# Patient Record
Sex: Female | Born: 1945 | Race: White | Hispanic: No | Marital: Married | Smoking: Never smoker
Health system: Southern US, Community
[De-identification: ages and names within clinical notes are randomized; demographics above are authoritative.]

## PROBLEM LIST (undated history)

## (undated) DIAGNOSIS — M2042 Other hammer toe(s) (acquired), left foot: Secondary | ICD-10-CM

## (undated) DIAGNOSIS — Z973 Presence of spectacles and contact lenses: Secondary | ICD-10-CM

## (undated) DIAGNOSIS — F419 Anxiety disorder, unspecified: Secondary | ICD-10-CM

## (undated) DIAGNOSIS — Z974 Presence of external hearing-aid: Secondary | ICD-10-CM

## (undated) HISTORY — PX: URETHROPEXY: SHX1081

## (undated) HISTORY — PX: OTHER SURGICAL HISTORY: SHX169

## (undated) HISTORY — PX: VAGINAL PROLAPSE REPAIR: SHX830

---

## 1973-11-23 HISTORY — PX: UMBILICAL HERNIA REPAIR: SHX196

## 1993-11-23 HISTORY — PX: VAGINAL PROLAPSE REPAIR: SHX830

## 1993-11-23 HISTORY — PX: URETHROPEXY: SHX1081

## 2000-04-29 ENCOUNTER — Other Ambulatory Visit: Admission: RE | Admit: 2000-04-29 | Discharge: 2000-04-29 | Payer: Self-pay | Admitting: Family Medicine

## 2007-08-15 ENCOUNTER — Other Ambulatory Visit: Admission: RE | Admit: 2007-08-15 | Discharge: 2007-08-15 | Payer: Self-pay | Admitting: Family Medicine

## 2012-07-08 ENCOUNTER — Ambulatory Visit (INDEPENDENT_AMBULATORY_CARE_PROVIDER_SITE_OTHER): Payer: BC Managed Care – PPO | Admitting: Physician Assistant

## 2012-07-08 VITALS — BP 132/84 | HR 65 | Temp 97.8°F | Resp 18 | Ht 61.5 in | Wt 149.0 lb

## 2012-07-08 DIAGNOSIS — H66009 Acute suppurative otitis media without spontaneous rupture of ear drum, unspecified ear: Secondary | ICD-10-CM

## 2012-07-08 DIAGNOSIS — S61409A Unspecified open wound of unspecified hand, initial encounter: Secondary | ICD-10-CM

## 2012-07-08 MED ORDER — AMOXICILLIN 875 MG PO TABS: 875.0000 mg | ORAL_TABLET | Freq: Two times a day (BID) | ORAL | Status: AC

## 2012-07-08 NOTE — Progress Notes (Signed)
  Subjective:    Patient ID: Morgan Gibbs, female    DOB: 04-26-1946, 66 y.o.   MRN: [id number]  HPI  Pt presents to clinic with 24h of ear pain.  She has been in [place] and developed a little cold which has improved but last pm she started to have R ear pain which has gotten worse.  She took some Aleve which did help.  She is having no more than normal hearing loss.    Review of Systems  Constitutional: Negative for fever and chills.  HENT: Positive for hearing loss, ear pain and congestion. Negative for tinnitus and ear discharge.        Objective:   Physical Exam  Vitals reviewed. Constitutional: She is oriented to person, place, and time. She appears well-developed and well-nourished.  HENT:  Head: Normocephalic and atraumatic.  Right Ear: External ear normal.  Left Ear: External ear normal.  Nose: Nose normal.  Mouth/Throat: Oropharynx is clear and moist. No oropharyngeal exudate.  Eyes: Conjunctivae are normal.  Neck: Neck supple.  Cardiovascular: Normal rate, regular rhythm and normal heart sounds.   No murmur heard. Pulmonary/Chest: Effort normal and breath sounds normal.  Lymphadenopathy:    She has no cervical adenopathy.  Neurological: She is alert and oriented to person, place, and time.  Skin: Skin is warm and dry.  Psychiatric: She has a normal mood and affect. Her behavior is normal. Judgment and thought content normal.          Assessment & Plan:   1. Acute suppurative otitis media without spontaneous rupture of eardrum  amoxicillin (AMOXIL) 875 MG tablet   Pt to continue Zyrtec and she can add Mucinex to help with any remaining sinus congestion from her cold.  She should continue Aleve for her pain.

## 2013-08-24 ENCOUNTER — Ambulatory Visit (INDEPENDENT_AMBULATORY_CARE_PROVIDER_SITE_OTHER): Payer: 59 | Admitting: Physician Assistant

## 2013-08-24 VITALS — BP 120/82 | HR 66 | Temp 98.2°F | Resp 18 | Ht 60.5 in | Wt 148.8 lb

## 2013-08-24 DIAGNOSIS — H60399 Other infective otitis externa, unspecified ear: Secondary | ICD-10-CM

## 2013-08-24 DIAGNOSIS — H6691 Otitis media, unspecified, right ear: Secondary | ICD-10-CM

## 2013-08-24 DIAGNOSIS — H60391 Other infective otitis externa, right ear: Secondary | ICD-10-CM

## 2013-08-24 DIAGNOSIS — H6690 Otitis media, unspecified, unspecified ear: Secondary | ICD-10-CM

## 2013-08-24 MED ORDER — CIPROFLOXACIN-DEXAMETHASONE 0.3-0.1 % OT SUSP: 4.0000 [drp] | Freq: Two times a day (BID) | OTIC | Status: DC

## 2013-08-24 MED ORDER — AMOXICILLIN 875 MG PO TABS: 875.0000 mg | ORAL_TABLET | Freq: Two times a day (BID) | ORAL | Status: DC

## 2013-08-24 NOTE — Patient Instructions (Addendum)
Begin taking the amoxicillin as directed.  Be sure to finish the full course.  Also begin using the drops as directed.  Continue using Zyrtec daily to help relieve congestion and ear pressure.  You can also try a short course (3 days or less) of Sudafed.  We can consider using a nasal spray like Flonase long term to help control allergies/congestion.  Please let us know if any symptoms are worsening or not improving   Otitis Media, Adult A middle ear infection is an infection in the space behind the eardrum. The medical name for this is "otitis media." It may happen after a common cold. It is caused by a germ that starts growing in that space. You may feel swollen glands in your neck on the side of the ear infection. HOME CARE INSTRUCTIONS   Take your medicine as directed until it is gone, even if you feel better after the first few days.  Only take over-the-counter or prescription medicines for pain, discomfort, or fever as directed by your caregiver.  Occasional use of a nasal decongestant a couple times per day may help with discomfort and help the eustachian tube to drain better. Follow up with your caregiver in 10 to 14 days or as directed, to be certain that the infection has cleared. Not keeping the appointment could result in a chronic or permanent injury, pain, hearing loss and disability. If there is any problem keeping the appointment, you must call back to this facility for assistance. SEEK IMMEDIATE MEDICAL CARE IF:   You are not getting better in 2 to 3 days.  You have pain that is not controlled with medication.  You feel worse instead of better.  You cannot use the medication as directed.  You develop swelling, redness or pain around the ear or stiffness in your neck. MAKE SURE YOU:   Understand these instructions.  Will watch your condition.  Will get help right away if you are not doing well or get worse. Document Released: 08/14/2004 Document Revised: 02/01/2012  Document Reviewed: 06/15/2008 [facility] Patient Information 2014 [place], [place].

## 2013-08-24 NOTE — Progress Notes (Signed)
  Subjective:    Patient ID: Morgan Gibbs, female    DOB: [birth year], 67 y.o.   MRN: [id number]  HPI   Morgan Gibbs is a pleasant 67 yr old female here with concern for right ear pain and drainage.  States the ear has been draining for 3 days now, just started hurting this morning.  Left ear is ok.  Has some mild congestion.  No nasal drainage, no cough.  No fever.  Hearing is diminished on the right, but states her hearing is reduced somewhat at baseline.  Otherwise feels well.    Review of Systems  Constitutional: Negative for fever and chills.  HENT: Positive for hearing loss, ear pain, congestion and ear discharge. Negative for sore throat, rhinorrhea, sneezing, postnasal drip and sinus pressure.   Respiratory: Negative for cough, shortness of breath and wheezing.   Cardiovascular: Negative.   Gastrointestinal: Negative.   Musculoskeletal: Negative.   Skin: Negative.   Neurological: Negative.        Objective:   Physical Exam  Vitals reviewed. Constitutional: She is oriented to person, place, and time. She appears well-developed and well-nourished. No distress.  HENT:  Head: Normocephalic and atraumatic.  Right Ear: There is drainage and tenderness. Tympanic membrane is perforated (possibly, could not visualize completely) and bulging (with purulent material visible behind TM). Decreased hearing is noted.  Left Ear: Tympanic membrane, external ear and ear canal normal.  Eyes: Conjunctivae are normal. No scleral icterus.  Cardiovascular: Normal rate, regular rhythm and normal heart sounds.   Pulmonary/Chest: Effort normal and breath sounds normal. She has no wheezes. She has no rales.  Neurological: She is alert and oriented to person, place, and time.  Skin: Skin is warm and dry.  Psychiatric: She has a normal mood and affect. Her behavior is normal.         Assessment & Plan:  Otitis media, right - Plan: amoxicillin (AMOXIL) 875 MG tablet  Otitis, externa,  infective, right - Plan: ciprofloxacin-dexamethasone (CIPRODEX) otic suspension   Morgan Gibbs is a 67 yr old female here with otitis media and otitis externa.  Will cover with both amoxicillin and ciprodex drops.  Zyrtec to help with congestion.  Tylenol/Advil for pain relief.  RTC if worsening or not improving.

## 2014-01-23 ENCOUNTER — Ambulatory Visit (INDEPENDENT_AMBULATORY_CARE_PROVIDER_SITE_OTHER): Payer: 59 | Admitting: Emergency Medicine

## 2014-01-23 VITALS — BP 126/78 | HR 67 | Temp 97.9°F | Resp 16 | Ht 60.5 in | Wt 147.2 lb

## 2014-01-23 DIAGNOSIS — H66019 Acute suppurative otitis media with spontaneous rupture of ear drum, unspecified ear: Secondary | ICD-10-CM

## 2014-01-23 DIAGNOSIS — H6120 Impacted cerumen, unspecified ear: Secondary | ICD-10-CM

## 2014-01-23 MED ORDER — AMOXICILLIN 500 MG PO CAPS: 500.0000 mg | ORAL_CAPSULE | Freq: Three times a day (TID) | ORAL | Status: DC

## 2014-01-23 MED ORDER — OFLOXACIN 0.3 % OT SOLN: 10.0000 [drp] | Freq: Every day | OTIC | Status: DC

## 2014-01-23 NOTE — Patient Instructions (Addendum)
Otitis Media With Effusion Otitis media with effusion is the presence of fluid in the middle ear. This is a common problem in children, which often follows ear infections. It may be present for weeks or longer after the infection. Unlike an acute ear infection, otitis media with effusion refers only to fluid behind the ear drum and not infection. Children with repeated ear and sinus infections and allergy problems are the most likely to get otitis media with effusion. CAUSES  The most frequent cause of the fluid buildup is dysfunction of the eustachian tubes. These are the tubes that drain fluid in the ears to the to the back of the nose (nasopharynx). SYMPTOMS   The main symptom of this condition is hearing loss. As a result, you or your child may:  Listen to the TV at a loud volume.  Not respond to questions.  Ask "what" often when spoken to.  Mistake or confuse on sound or word for another.  There may be a sensation of fullness or pressure but usually not pain. DIAGNOSIS   Your health care provider will diagnose this condition by examining you or your child's ears.  Your health care provider may test the pressure in you or your child's ear with a tympanometer.  A hearing test may be conducted if the problem persists. TREATMENT   Treatment depends on the duration and the effects of the effusion.  Antibiotics, decongestants, nose drops, and cortisone-type drugs (tablets or nasal spray) may not be helpful.  Children with persistent ear effusions may have delayed language or behavioral problems. Children at risk for developmental delays in hearing, learning, and speech may require referral to a specialist earlier than children not at risk.  You or your child's health care provider may suggest a referral to an ear, nose, and throat surgeon for treatment. The following may help restore normal hearing:  Drainage of fluid.  Placement of ear tubes (tympanostomy tubes).  Removal of  adenoids (adenoidectomy). HOME CARE INSTRUCTIONS   Avoid second hand smoke.  Infants who are breast fed are less likely to have this condition.  Avoid feeding infants while laying flat.  Avoid known environmental allergens.  Avoid people who are sick. SEEK MEDICAL CARE IF:   Hearing is not better in 3 months.  Hearing is worse.  Ear pain.  Drainage from the ear.  Dizziness. MAKE SURE YOU:   Understand these instructions.  Will watch your condition.  Will get help right away if you are not doing well or get worse. Document Released: 12/17/2004 Document Revised: 08/30/2013 Document Reviewed: 06/06/2013 [employer] Patient Information 2014 [employer].  

## 2014-01-23 NOTE — Progress Notes (Signed)
[facility] [address], [place] [id number] [phone]  Date:  01/23/2014   Name:  Morgan CorriganChristine E Gibbs   DOB:  06/19/1946   MRN:  [id number]  PCP:  No PCP Per Patient    Chief Complaint: Otalgia   History of Present Illness:  Morgan Gibbs is a 68 y.o. very pleasant female patient who presents with the following:  1 week history of pain in both ears and nasal congestion. No fever or chills.  No drainage.  No sore throat.  No improvement with over the counter medications or other home remedies.  Denies other complaint or health concern today.   There are no active problems to display for this patient.   History reviewed. No pertinent past medical history.  Past Surgical History  Procedure Laterality Date  . Bladder tact      History  Substance Use Topics  . Smoking status: Never Smoker   . Smokeless tobacco: Not on file  . Alcohol Use: No    History reviewed. No pertinent family history.  No Known Allergies  Medication list has been reviewed and updated.  No current outpatient prescriptions on file prior to visit.   No current facility-administered medications on file prior to visit.    Review of Systems:  As per HPI, otherwise negative.    Physical Examination: Filed Vitals:   01/23/14 1412  BP: 126/78  Pulse: 67  Temp: 97.9 F (36.6 C)  Resp: 16   Filed Vitals:   01/23/14 1412  Height: 5' 0.5" (1.537 m)  Weight: 147 lb 3.2 oz (66.769 kg)   Body mass index is 28.26 kg/(m^2). Ideal Body Weight: Weight in (lb) to have BMI = 25: 129.9  GEN: WDWN, NAD, Non-toxic, A & O x 3 HEENT: Atraumatic, Normocephalic. Neck supple. No masses, No LAD.  Left perforated TM with drainage Ears and Nose: No external deformity. CV: RRR, No M/G/R. No JVD. No thrill. No extra heart sounds. PULM: CTA B, no wheezes, crackles, rhonchi. No retractions. No resp. distress. No accessory muscle use. ABD: S, NT, ND, +BS. No rebound. No  HSM. EXTR: No c/c/e NEURO Normal gait.  PSYCH: Normally interactive. Conversant. Not depressed or anxious appearing.  Calm demeanor.    Assessment and Plan: Right cerumen impaction Left OM with perforation floxin otic Amoxicillin ENT  Signed,  Phillips OdorJeffery Evianna Chandran, MD

## 2014-08-03 ENCOUNTER — Encounter: Payer: Self-pay | Admitting: *Deleted

## 2014-08-28 ENCOUNTER — Encounter: Payer: Self-pay | Admitting: *Deleted

## 2015-01-21 ENCOUNTER — Encounter: Payer: Self-pay | Admitting: *Deleted

## 2015-03-26 ENCOUNTER — Encounter: Payer: Self-pay | Admitting: *Deleted

## 2016-11-23 DIAGNOSIS — E039 Hypothyroidism, unspecified: Secondary | ICD-10-CM

## 2016-11-23 DIAGNOSIS — E079 Disorder of thyroid, unspecified: Secondary | ICD-10-CM

## 2016-11-23 HISTORY — DX: Disorder of thyroid, unspecified: E07.9

## 2016-11-23 HISTORY — DX: Hypothyroidism, unspecified: E03.9

## 2017-02-09 ENCOUNTER — Other Ambulatory Visit (HOSPITAL_COMMUNITY)
Admission: RE | Admit: 2017-02-09 | Discharge: 2017-02-09 | Disposition: A | Discharge status: Home / Self Care | Payer: Medicare Other | Source: Ambulatory Visit | Attending: Family Medicine | Admitting: Family Medicine

## 2017-02-09 ENCOUNTER — Other Ambulatory Visit: Payer: Self-pay | Admitting: Family Medicine

## 2017-02-09 DIAGNOSIS — Z124 Encounter for screening for malignant neoplasm of cervix: Secondary | ICD-10-CM | POA: Diagnosis present

## 2017-02-11 LAB — CYTOLOGY - PAP: DIAGNOSIS: NEGATIVE

## 2017-03-23 ENCOUNTER — Ambulatory Visit (HOSPITAL_COMMUNITY)
Admission: EM | Admit: 2017-03-23 | Discharge: 2017-03-23 | Disposition: A | Discharge status: Home / Self Care | Payer: Medicare Other | Attending: Family Medicine | Admitting: Family Medicine

## 2017-03-23 ENCOUNTER — Encounter (HOSPITAL_COMMUNITY): Payer: Self-pay | Admitting: Family Medicine

## 2017-03-23 DIAGNOSIS — R0981 Nasal congestion: Secondary | ICD-10-CM

## 2017-03-23 DIAGNOSIS — F410 Panic disorder [episodic paroxysmal anxiety] without agoraphobia: Secondary | ICD-10-CM | POA: Diagnosis not present

## 2017-03-23 MED ORDER — DIAZEPAM 5 MG PO TABS: 5.0000 mg | ORAL_TABLET | Freq: Two times a day (BID) | ORAL | 0 refills | Status: DC | PRN

## 2017-03-23 MED ORDER — IPRATROPIUM BROMIDE 0.03 % NA SOLN: 2.0000 | Freq: Two times a day (BID) | NASAL | 0 refills | Status: DC

## 2017-03-23 NOTE — ED Provider Notes (Signed)
[facility]    CSN: [id number] Arrival date & time: 03/23/17  1859     History   Chief Complaint Chief Complaint  Patient presents with  . Shortness of Breath    HPI Morgan Gibbs is a 71 y.o. female presenting for "trouble getting air in."   She reports 3 weeks of URI symptoms including primarily congestion, as well as runny nose that have persisted constantly despite treatment with augmentin prescribed at urgent care. She has also used flonase, afrin, nasal saline, and mucinex D which have not improved symptoms. She has noticed, and this is corroborated by her husband, increasing anxiety related to inability to breathe in enough through her nose. She reports having a panic attack this morning due to this. She denies fevers, chest pain, palpitations.   HPI  History reviewed. No pertinent past medical history.  There are no active problems to display for this patient.   Past Surgical History:  Procedure Laterality Date  . Bladder tact      OB History    No data available       Home Medications    Prior to Admission medications   Medication Sig Start Date End Date Taking? Authorizing Provider  amoxicillin (AMOXIL) 500 MG capsule Take 1 capsule (500 mg total) by mouth 3 (three) times daily. 01/23/14   Carmelina Dane, MD  diazepam (VALIUM) 5 MG tablet Take 1 tablet (5 mg total) by mouth every 12 (twelve) hours as needed for anxiety. 03/23/17   Tyrone Nine, MD  ipratropium (ATROVENT) 0.03 % nasal spray Place 2 sprays into both nostrils every 12 (twelve) hours. 03/23/17   Tyrone Nine, MD  ofloxacin (FLOXIN) 0.3 % otic solution Place 10 drops into the left ear daily. 01/23/14   Carmelina Dane, MD    Family History History reviewed. No pertinent family history.  Social History Social History  Substance Use Topics  . Smoking status: Never Smoker  . Smokeless tobacco: Never Used  . Alcohol use No     Allergies   Patient has no known  allergies.   Review of Systems Review of Systems Per HPI  Physical Exam Triage Vital Signs ED Triage Vitals [03/23/17 1920]  Enc Vitals Group     BP (!) 155/100     Pulse Rate 74     Resp 20     Temp 98.2 F (36.8 C)     Temp src      SpO2 100 %     Weight      Height      Head Circumference      Peak Flow      Pain Score      Pain Loc      Pain Edu?      Excl. in GC?    No data found.   Updated Vital Signs BP (!) 155/100   Pulse 74   Temp 98.2 F (36.8 C)   Resp 20   SpO2 100%   Physical Exam  Constitutional: She appears well-developed and well-nourished. No distress.  HENT:  Head: Normocephalic and atraumatic.  Right Ear: External ear normal.  Left Ear: External ear normal.  Mouth/Throat: Oropharynx is clear and moist.  - tympanosclerosis noted, bilateral hearing aids.  - no oropharyngeal abnormality - turbinates occlusive/boggy bilaterally without discharge  Eyes: Conjunctivae are normal. Pupils are equal, round, and reactive to light.  Neck: Neck supple. No JVD present.  Cardiovascular: Normal rate and regular rhythm.  No murmur heard. Pulmonary/Chest: Effort normal and breath sounds normal. No respiratory distress.  Abdominal: Soft. There is no tenderness.  Musculoskeletal: She exhibits no edema.  Lymphadenopathy:    She has no cervical adenopathy.  Neurological: She is alert.  Skin: Skin is warm and dry.  Psychiatric: She has a normal mood and affect.  Nursing note and vitals reviewed.    UC Treatments / Results  Labs (all labs ordered are listed, but only abnormal results are displayed) Labs Reviewed - No data to display  EKG  EKG Interpretation None       Radiology No results found.  Procedures Procedures (including critical care time)  Medications Ordered in UC Medications - No data to display   Initial Impression / Assessment and Plan / UC Course  I have reviewed the triage vital signs and the nursing  notes.  Pertinent labs & imaging results that were available during my care of the patient were reviewed by me and considered in my medical decision making (see chart for details).  Final Clinical Impressions(s) / UC Diagnoses   Final diagnoses:  Nasal congestion  Panic attack   71 y.o. female with nasal congestion refractory to multiple treatments causing anxiety related to inability to breathe through nose. This caused a panic attack this morning. There are no exam findings suggestive of true hypoxemia or pulmonary disease.  - DC afrin, suspect rebound congestion - Trial atrovent nasal spray - Continue other therapies - Will Rx short course of low dose benzodiazepine with strict precautions provided.  - Follow up with PCP in next week. New Prescriptions New Prescriptions   DIAZEPAM (VALIUM) 5 MG TABLET    Take 1 tablet (5 mg total) by mouth every 12 (twelve) hours as needed for anxiety.   IPRATROPIUM (ATROVENT) 0.03 % NASAL SPRAY    Place 2 sprays into both nostrils every 12 (twelve) hours.     Tyrone Nine, MD 03/23/17 2006

## 2017-03-23 NOTE — ED Triage Notes (Signed)
Pt here for cough, SOB and feeling anxious. sts that at night she is having trouble breathing and it then makes her anxious and her lips become tingly. Sts that she was recently treated for sinus infection and not better. No fever.

## 2019-11-24 DIAGNOSIS — J189 Pneumonia, unspecified organism: Secondary | ICD-10-CM

## 2019-11-24 HISTORY — DX: Pneumonia, unspecified organism: J18.9

## 2019-12-14 ENCOUNTER — Encounter (HOSPITAL_COMMUNITY): Payer: Self-pay

## 2019-12-14 ENCOUNTER — Emergency Department (HOSPITAL_COMMUNITY)
Admission: EM | Admit: 2019-12-14 | Discharge: 2019-12-14 | Disposition: A | Discharge status: Home / Self Care | Payer: Medicare PPO | Attending: Emergency Medicine | Admitting: Emergency Medicine

## 2019-12-14 ENCOUNTER — Other Ambulatory Visit: Payer: Self-pay

## 2019-12-14 ENCOUNTER — Emergency Department (HOSPITAL_COMMUNITY): Payer: Medicare PPO

## 2019-12-14 DIAGNOSIS — U071 COVID-19: Secondary | ICD-10-CM | POA: Diagnosis not present

## 2019-12-14 DIAGNOSIS — R509 Fever, unspecified: Secondary | ICD-10-CM | POA: Diagnosis present

## 2019-12-14 DIAGNOSIS — J1282 Pneumonia due to coronavirus disease 2019: Secondary | ICD-10-CM | POA: Diagnosis not present

## 2019-12-14 NOTE — ED Notes (Signed)
An After Visit Summary was printed and given to the patient. Discharge instructions given and no further questions at this time. Pt denies SOB and chest pain at this time.  

## 2019-12-14 NOTE — ED Provider Notes (Signed)
[facility]EMERGENCY [facility]PT Provider Note   CSN: [id number] Arrival date & time: 12/14/19  1001     History Chief Complaint  Patient presents with  . COVID Positive  . Fever  . Fatigue    Morgan Gibbs is a 74 y.o. female.  Presents emerged from chief complaint Covid symptoms.  Patient states she was diagnosed with Covid on Monday, has had symptoms for approximately 8 days so far.  States she has been having low-grade fevers daily, has been having severe fatigue, occasional body aches, as well as dry cough.  Over the last few days she has felt somewhat more short of breath.  No difficulty in breathing at rest, states she feels short of breath when she tries to take a deep breath.  No chest pain.  Denies any chronic medical problems, no smoking history, no DVT, PE, CAD.  HPI     History reviewed. No pertinent past medical history.  There are no problems to display for this patient.   Past Surgical History:  Procedure Laterality Date  . Bladder tact       OB History   No obstetric history on file.     History reviewed. No pertinent family history.  Social History   Tobacco Use  . Smoking status: Never Smoker  . Smokeless tobacco: Never Used  Substance Use Topics  . Alcohol use: No  . Drug use: No    Home Medications Prior to Admission medications   Medication Sig Start Date End Date Taking? Authorizing Provider  amoxicillin (AMOXIL) 500 MG capsule Take 1 capsule (500 mg total) by mouth 3 (three) times daily. 01/23/14   Carmelina Dane, MD  diazepam (VALIUM) 5 MG tablet Take 1 tablet (5 mg total) by mouth every 12 (twelve) hours as needed for anxiety. 03/23/17   Tyrone Nine, MD  ipratropium (ATROVENT) 0.03 % nasal spray Place 2 sprays into both nostrils every 12 (twelve) hours. 03/23/17   Tyrone Nine, MD  ofloxacin (FLOXIN) 0.3 % otic solution Place 10 drops into the left ear daily. 01/23/14   Carmelina Dane, MD    Allergies     Patient has no known allergies.  Review of Systems   Review of Systems  Constitutional: Positive for chills, fatigue and fever.  HENT: Negative for ear pain and sore throat.   Eyes: Negative for pain and visual disturbance.  Respiratory: Positive for cough and shortness of breath.   Cardiovascular: Negative for chest pain and palpitations.  Gastrointestinal: Negative for abdominal pain and vomiting.  Genitourinary: Negative for dysuria and hematuria.  Musculoskeletal: Negative for arthralgias and back pain.  Skin: Negative for color change and rash.  Neurological: Negative for seizures and syncope.  All other systems reviewed and are negative.   Physical Exam Updated Vital Signs BP (!) 131/96   Pulse 95   Temp 99 F (37.2 C) (Oral)   Resp 19   SpO2 95%   Physical Exam Vitals and nursing note reviewed.  Constitutional:      General: She is not in acute distress.    Appearance: She is well-developed.  HENT:     Head: Normocephalic and atraumatic.  Eyes:     Conjunctiva/sclera: Conjunctivae normal.  Cardiovascular:     Rate and Rhythm: Normal rate and regular rhythm.     Heart sounds: No murmur.  Pulmonary:     Effort: Pulmonary effort is normal. No respiratory distress.     Breath sounds: Normal breath  sounds.     Comments: Well-appearing, speaking in full sentences, clear lungs, no tachypnea, no accessory muscle use Abdominal:     Palpations: Abdomen is soft.     Tenderness: There is no abdominal tenderness.  Musculoskeletal:        General: No swelling or tenderness.     Cervical back: Neck supple.  Skin:    General: Skin is warm and dry.  Neurological:     General: No focal deficit present.     Mental Status: She is alert and oriented to person, place, and time.  Psychiatric:        Mood and Affect: Mood normal.        Behavior: Behavior normal.     ED Results / Procedures / Treatments   Labs (all labs ordered are listed, but only abnormal results are  displayed) Labs Reviewed - No data to display  EKG None  Radiology DG Chest Portable 1 View  Result Date: 12/14/2019 CLINICAL DATA:  COVID positive EXAM: PORTABLE CHEST 1 VIEW COMPARISON:  None. FINDINGS: Bilateral pulmonary opacities are present. No pleural effusion or pneumothorax. Cardiomediastinal contours are within normal limits. IMPRESSION: Bilateral pulmonary opacities, which may reflect COVID-19 related pneumonia. Electronically Signed   By: Macy Mis M.D.   On: 12/14/2019 11:14    Procedures Procedures (including critical care time)  Medications Ordered in ED Medications - No data to display  ED Course  I have reviewed the triage vital signs and the nursing notes.  Pertinent labs & imaging results that were available during my care of the patient were reviewed by me and considered in my medical decision making (see chart for details).    MDM Rules/Calculators/A&P                      74 year old otherwise healthy lady presents to ER with ongoing symptoms of COVID-19.  On exam currently she is well-appearing, normal oxygen saturations, no tachypnea.  CXR demonstrates b/l opacities most likely covid pna.  At this time I believe she is appropriate for outpatient management.  Reviewed strict return precautions with patient, recommended virtual recheck with your primary doctor.    After the discussed management above, the patient was determined to be safe for discharge.  The patient was in agreement with this plan and all questions regarding their care were answered.  ED return precautions were discussed and the patient will return to the ED with any significant worsening of condition.     Morgan Gibbs was evaluated in Emergency Department on 12/14/2019 for the symptoms described in the history of present illness. She was evaluated in the context of the global COVID-19 pandemic, which necessitated consideration that the patient might be at risk for infection  with the SARS-CoV-2 virus that causes COVID-19. Institutional protocols and algorithms that pertain to the evaluation of patients at risk for COVID-19 are in a state of rapid change based on information released by regulatory bodies including the CDC and federal and state organizations. These policies and algorithms were followed during the patient's care in the ED.   Final Clinical Impression(s) / ED Diagnoses Final diagnoses:  COVID-19  Pneumonia due to COVID-19 virus    Rx / DC Orders ED Discharge Orders    None       Lucrezia Starch, MD 12/14/19 1729

## 2019-12-14 NOTE — ED Triage Notes (Signed)
Pt was dx with COVID on Monday, has had fever since Wednesday prior. Pt states that she has been alternating tylenol and ibuprofen for her fever. Pt states that she is feeling worn down . Pt states that she was told to come in since she was satting 91% at home (satting 95% in triage). Pt states that she did a peppermint oil breathing treatment without relief.

## 2019-12-14 NOTE — Discharge Instructions (Signed)
Recommend calling your primary doctor to schedule a virtual recheck.  If you have any worsening of your breathing, develop chest pain, other new concerning symptom, please return to ER for reassessment.  Otherwise, recommend continuing your Tylenol Motrin as needed for fevers and body aches.

## 2020-01-22 DIAGNOSIS — M546 Pain in thoracic spine: Secondary | ICD-10-CM | POA: Diagnosis not present

## 2020-01-22 DIAGNOSIS — M9902 Segmental and somatic dysfunction of thoracic region: Secondary | ICD-10-CM | POA: Diagnosis not present

## 2020-01-22 DIAGNOSIS — M9903 Segmental and somatic dysfunction of lumbar region: Secondary | ICD-10-CM | POA: Diagnosis not present

## 2020-01-22 DIAGNOSIS — M9904 Segmental and somatic dysfunction of sacral region: Secondary | ICD-10-CM | POA: Diagnosis not present

## 2020-01-22 DIAGNOSIS — M4307 Spondylolysis, lumbosacral region: Secondary | ICD-10-CM | POA: Diagnosis not present

## 2020-01-22 DIAGNOSIS — M545 Low back pain: Secondary | ICD-10-CM | POA: Diagnosis not present

## 2020-02-26 DIAGNOSIS — M4307 Spondylolysis, lumbosacral region: Secondary | ICD-10-CM | POA: Diagnosis not present

## 2020-02-26 DIAGNOSIS — M9902 Segmental and somatic dysfunction of thoracic region: Secondary | ICD-10-CM | POA: Diagnosis not present

## 2020-02-26 DIAGNOSIS — M9904 Segmental and somatic dysfunction of sacral region: Secondary | ICD-10-CM | POA: Diagnosis not present

## 2020-02-26 DIAGNOSIS — M9903 Segmental and somatic dysfunction of lumbar region: Secondary | ICD-10-CM | POA: Diagnosis not present

## 2020-02-26 DIAGNOSIS — M545 Low back pain: Secondary | ICD-10-CM | POA: Diagnosis not present

## 2020-02-26 DIAGNOSIS — M546 Pain in thoracic spine: Secondary | ICD-10-CM | POA: Diagnosis not present

## 2020-03-25 DIAGNOSIS — M5388 Other specified dorsopathies, sacral and sacrococcygeal region: Secondary | ICD-10-CM | POA: Diagnosis not present

## 2020-03-25 DIAGNOSIS — M546 Pain in thoracic spine: Secondary | ICD-10-CM | POA: Diagnosis not present

## 2020-03-25 DIAGNOSIS — M9902 Segmental and somatic dysfunction of thoracic region: Secondary | ICD-10-CM | POA: Diagnosis not present

## 2020-03-25 DIAGNOSIS — M9904 Segmental and somatic dysfunction of sacral region: Secondary | ICD-10-CM | POA: Diagnosis not present

## 2020-03-25 DIAGNOSIS — M9903 Segmental and somatic dysfunction of lumbar region: Secondary | ICD-10-CM | POA: Diagnosis not present

## 2020-03-25 DIAGNOSIS — M545 Low back pain: Secondary | ICD-10-CM | POA: Diagnosis not present

## 2020-04-05 DIAGNOSIS — M9903 Segmental and somatic dysfunction of lumbar region: Secondary | ICD-10-CM | POA: Diagnosis not present

## 2020-04-05 DIAGNOSIS — M5388 Other specified dorsopathies, sacral and sacrococcygeal region: Secondary | ICD-10-CM | POA: Diagnosis not present

## 2020-04-05 DIAGNOSIS — M545 Low back pain: Secondary | ICD-10-CM | POA: Diagnosis not present

## 2020-04-05 DIAGNOSIS — M546 Pain in thoracic spine: Secondary | ICD-10-CM | POA: Diagnosis not present

## 2020-04-05 DIAGNOSIS — M9904 Segmental and somatic dysfunction of sacral region: Secondary | ICD-10-CM | POA: Diagnosis not present

## 2020-04-05 DIAGNOSIS — M9902 Segmental and somatic dysfunction of thoracic region: Secondary | ICD-10-CM | POA: Diagnosis not present

## 2020-04-26 DIAGNOSIS — M546 Pain in thoracic spine: Secondary | ICD-10-CM | POA: Diagnosis not present

## 2020-04-26 DIAGNOSIS — M9903 Segmental and somatic dysfunction of lumbar region: Secondary | ICD-10-CM | POA: Diagnosis not present

## 2020-04-26 DIAGNOSIS — M5388 Other specified dorsopathies, sacral and sacrococcygeal region: Secondary | ICD-10-CM | POA: Diagnosis not present

## 2020-04-26 DIAGNOSIS — M9902 Segmental and somatic dysfunction of thoracic region: Secondary | ICD-10-CM | POA: Diagnosis not present

## 2020-04-26 DIAGNOSIS — M9904 Segmental and somatic dysfunction of sacral region: Secondary | ICD-10-CM | POA: Diagnosis not present

## 2020-04-26 DIAGNOSIS — M545 Low back pain: Secondary | ICD-10-CM | POA: Diagnosis not present

## 2020-06-05 DIAGNOSIS — M4727 Other spondylosis with radiculopathy, lumbosacral region: Secondary | ICD-10-CM | POA: Diagnosis not present

## 2020-06-05 DIAGNOSIS — M9904 Segmental and somatic dysfunction of sacral region: Secondary | ICD-10-CM | POA: Diagnosis not present

## 2020-06-05 DIAGNOSIS — M9901 Segmental and somatic dysfunction of cervical region: Secondary | ICD-10-CM | POA: Diagnosis not present

## 2020-06-05 DIAGNOSIS — M9903 Segmental and somatic dysfunction of lumbar region: Secondary | ICD-10-CM | POA: Diagnosis not present

## 2020-06-05 DIAGNOSIS — M47812 Spondylosis without myelopathy or radiculopathy, cervical region: Secondary | ICD-10-CM | POA: Diagnosis not present

## 2020-06-05 DIAGNOSIS — M5388 Other specified dorsopathies, sacral and sacrococcygeal region: Secondary | ICD-10-CM | POA: Diagnosis not present

## 2020-07-10 DIAGNOSIS — M47812 Spondylosis without myelopathy or radiculopathy, cervical region: Secondary | ICD-10-CM | POA: Diagnosis not present

## 2020-07-10 DIAGNOSIS — M9901 Segmental and somatic dysfunction of cervical region: Secondary | ICD-10-CM | POA: Diagnosis not present

## 2020-07-10 DIAGNOSIS — M9904 Segmental and somatic dysfunction of sacral region: Secondary | ICD-10-CM | POA: Diagnosis not present

## 2020-07-10 DIAGNOSIS — M9903 Segmental and somatic dysfunction of lumbar region: Secondary | ICD-10-CM | POA: Diagnosis not present

## 2020-07-10 DIAGNOSIS — M5388 Other specified dorsopathies, sacral and sacrococcygeal region: Secondary | ICD-10-CM | POA: Diagnosis not present

## 2020-07-10 DIAGNOSIS — M4727 Other spondylosis with radiculopathy, lumbosacral region: Secondary | ICD-10-CM | POA: Diagnosis not present

## 2020-07-31 DIAGNOSIS — M4727 Other spondylosis with radiculopathy, lumbosacral region: Secondary | ICD-10-CM | POA: Diagnosis not present

## 2020-07-31 DIAGNOSIS — M9904 Segmental and somatic dysfunction of sacral region: Secondary | ICD-10-CM | POA: Diagnosis not present

## 2020-07-31 DIAGNOSIS — M9903 Segmental and somatic dysfunction of lumbar region: Secondary | ICD-10-CM | POA: Diagnosis not present

## 2020-07-31 DIAGNOSIS — M9901 Segmental and somatic dysfunction of cervical region: Secondary | ICD-10-CM | POA: Diagnosis not present

## 2020-07-31 DIAGNOSIS — M4302 Spondylolysis, cervical region: Secondary | ICD-10-CM | POA: Diagnosis not present

## 2020-07-31 DIAGNOSIS — M5388 Other specified dorsopathies, sacral and sacrococcygeal region: Secondary | ICD-10-CM | POA: Diagnosis not present

## 2020-08-30 DIAGNOSIS — M9901 Segmental and somatic dysfunction of cervical region: Secondary | ICD-10-CM | POA: Diagnosis not present

## 2020-08-30 DIAGNOSIS — M9903 Segmental and somatic dysfunction of lumbar region: Secondary | ICD-10-CM | POA: Diagnosis not present

## 2020-08-30 DIAGNOSIS — M5388 Other specified dorsopathies, sacral and sacrococcygeal region: Secondary | ICD-10-CM | POA: Diagnosis not present

## 2020-08-30 DIAGNOSIS — M4727 Other spondylosis with radiculopathy, lumbosacral region: Secondary | ICD-10-CM | POA: Diagnosis not present

## 2020-08-30 DIAGNOSIS — M9904 Segmental and somatic dysfunction of sacral region: Secondary | ICD-10-CM | POA: Diagnosis not present

## 2020-08-30 DIAGNOSIS — M4302 Spondylolysis, cervical region: Secondary | ICD-10-CM | POA: Diagnosis not present

## 2020-09-30 DIAGNOSIS — M9903 Segmental and somatic dysfunction of lumbar region: Secondary | ICD-10-CM | POA: Diagnosis not present

## 2020-09-30 DIAGNOSIS — M4302 Spondylolysis, cervical region: Secondary | ICD-10-CM | POA: Diagnosis not present

## 2020-09-30 DIAGNOSIS — M9901 Segmental and somatic dysfunction of cervical region: Secondary | ICD-10-CM | POA: Diagnosis not present

## 2020-09-30 DIAGNOSIS — M9902 Segmental and somatic dysfunction of thoracic region: Secondary | ICD-10-CM | POA: Diagnosis not present

## 2020-09-30 DIAGNOSIS — M47814 Spondylosis without myelopathy or radiculopathy, thoracic region: Secondary | ICD-10-CM | POA: Diagnosis not present

## 2020-09-30 DIAGNOSIS — M4727 Other spondylosis with radiculopathy, lumbosacral region: Secondary | ICD-10-CM | POA: Diagnosis not present

## 2020-10-29 DIAGNOSIS — M47814 Spondylosis without myelopathy or radiculopathy, thoracic region: Secondary | ICD-10-CM | POA: Diagnosis not present

## 2020-10-29 DIAGNOSIS — M9902 Segmental and somatic dysfunction of thoracic region: Secondary | ICD-10-CM | POA: Diagnosis not present

## 2020-10-29 DIAGNOSIS — M4302 Spondylolysis, cervical region: Secondary | ICD-10-CM | POA: Diagnosis not present

## 2020-10-29 DIAGNOSIS — M4727 Other spondylosis with radiculopathy, lumbosacral region: Secondary | ICD-10-CM | POA: Diagnosis not present

## 2020-10-29 DIAGNOSIS — M9901 Segmental and somatic dysfunction of cervical region: Secondary | ICD-10-CM | POA: Diagnosis not present

## 2020-10-29 DIAGNOSIS — M9903 Segmental and somatic dysfunction of lumbar region: Secondary | ICD-10-CM | POA: Diagnosis not present

## 2020-11-28 DIAGNOSIS — M4302 Spondylolysis, cervical region: Secondary | ICD-10-CM | POA: Diagnosis not present

## 2020-11-28 DIAGNOSIS — M9904 Segmental and somatic dysfunction of sacral region: Secondary | ICD-10-CM | POA: Diagnosis not present

## 2020-11-28 DIAGNOSIS — M4307 Spondylolysis, lumbosacral region: Secondary | ICD-10-CM | POA: Diagnosis not present

## 2020-11-28 DIAGNOSIS — M9901 Segmental and somatic dysfunction of cervical region: Secondary | ICD-10-CM | POA: Diagnosis not present

## 2020-11-28 DIAGNOSIS — M9903 Segmental and somatic dysfunction of lumbar region: Secondary | ICD-10-CM | POA: Diagnosis not present

## 2020-11-28 DIAGNOSIS — M5388 Other specified dorsopathies, sacral and sacrococcygeal region: Secondary | ICD-10-CM | POA: Diagnosis not present

## 2021-01-01 DIAGNOSIS — M5388 Other specified dorsopathies, sacral and sacrococcygeal region: Secondary | ICD-10-CM | POA: Diagnosis not present

## 2021-01-01 DIAGNOSIS — M4302 Spondylolysis, cervical region: Secondary | ICD-10-CM | POA: Diagnosis not present

## 2021-01-01 DIAGNOSIS — M9903 Segmental and somatic dysfunction of lumbar region: Secondary | ICD-10-CM | POA: Diagnosis not present

## 2021-01-01 DIAGNOSIS — M4307 Spondylolysis, lumbosacral region: Secondary | ICD-10-CM | POA: Diagnosis not present

## 2021-01-01 DIAGNOSIS — M9901 Segmental and somatic dysfunction of cervical region: Secondary | ICD-10-CM | POA: Diagnosis not present

## 2021-01-01 DIAGNOSIS — M9904 Segmental and somatic dysfunction of sacral region: Secondary | ICD-10-CM | POA: Diagnosis not present

## 2021-01-23 IMAGING — DX DG CHEST 1V PORT
1 series · 1 of 1 positions shown · non-contrast
Comparison: None.

CLINICAL DATA: COVID positive

EXAM:
PORTABLE CHEST 1 VIEW

[chest ap]
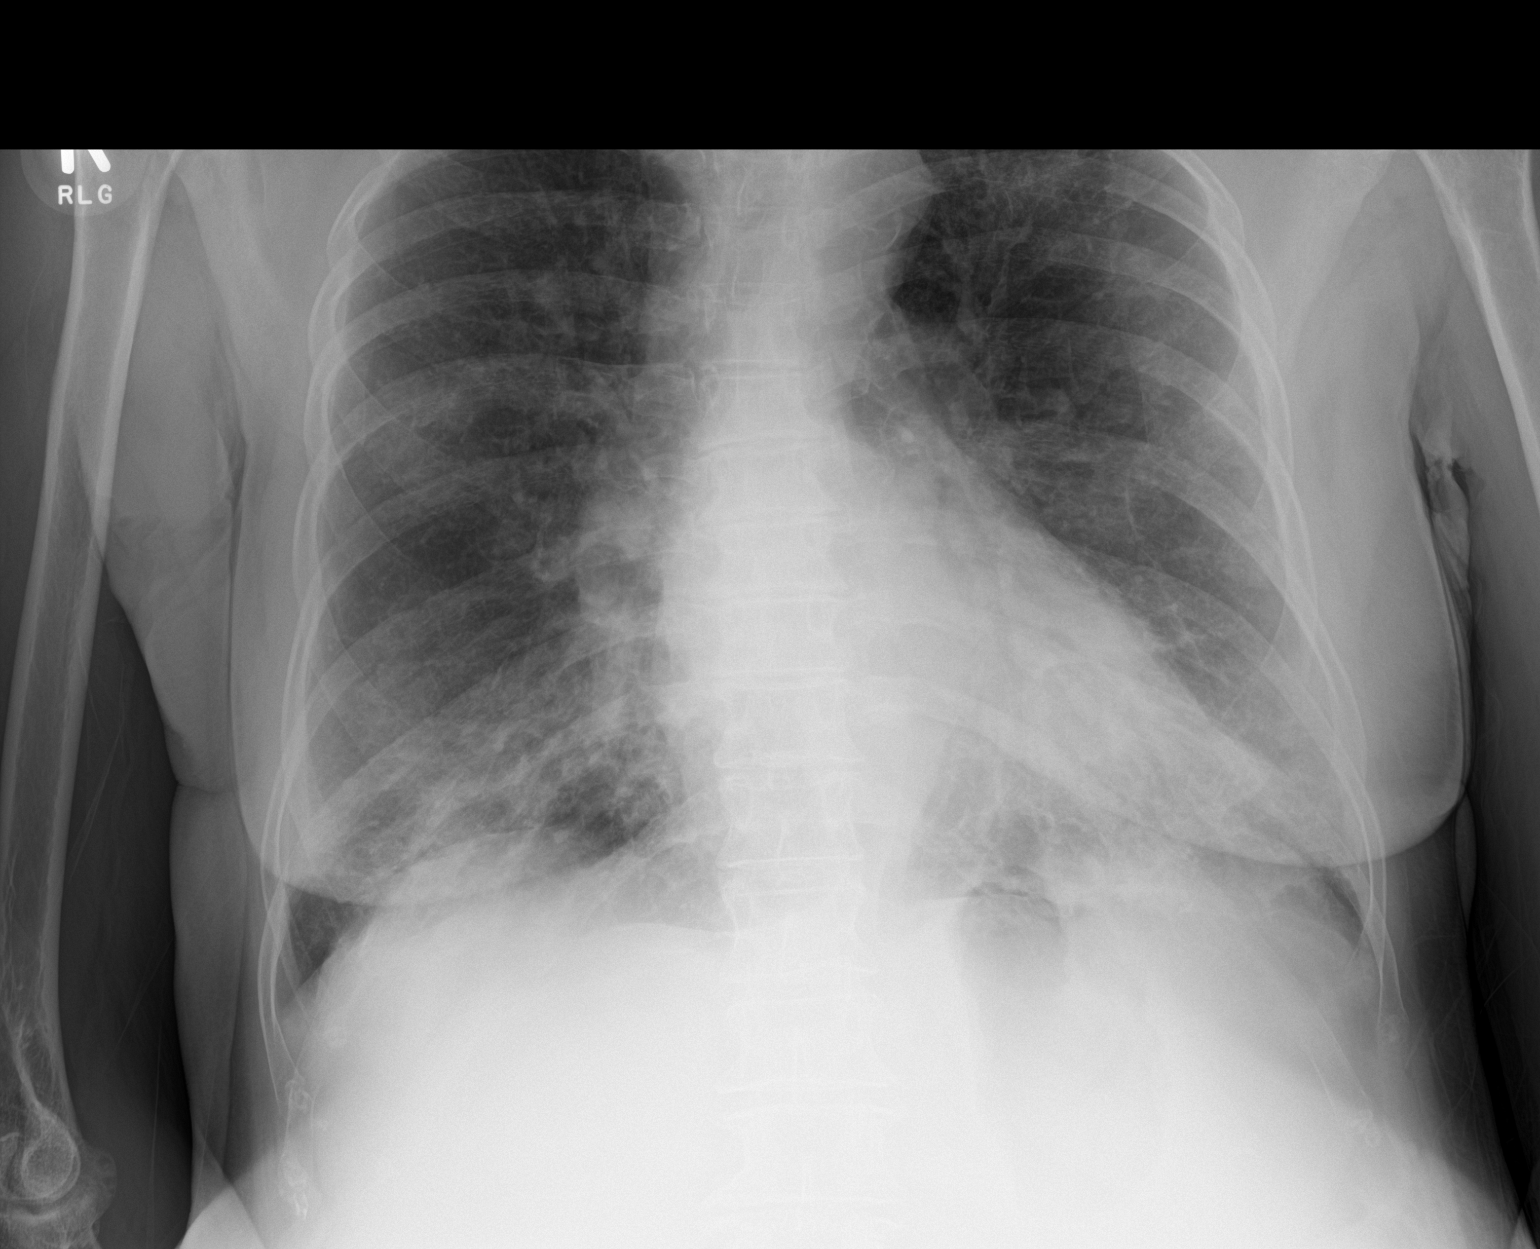

[1 of 1 positions shown; findings below may reference images not displayed]

FINDINGS: Bilateral pulmonary opacities are present. No pleural effusion or
pneumothorax. Cardiomediastinal contours are within normal limits.
IMPRESSION: Bilateral pulmonary opacities, which [DATE] related
pneumonia.

## 2021-01-31 DIAGNOSIS — M9903 Segmental and somatic dysfunction of lumbar region: Secondary | ICD-10-CM | POA: Diagnosis not present

## 2021-01-31 DIAGNOSIS — M47894 Other spondylosis, thoracic region: Secondary | ICD-10-CM | POA: Diagnosis not present

## 2021-01-31 DIAGNOSIS — M9902 Segmental and somatic dysfunction of thoracic region: Secondary | ICD-10-CM | POA: Diagnosis not present

## 2021-01-31 DIAGNOSIS — M9901 Segmental and somatic dysfunction of cervical region: Secondary | ICD-10-CM | POA: Diagnosis not present

## 2021-01-31 DIAGNOSIS — M4723 Other spondylosis with radiculopathy, cervicothoracic region: Secondary | ICD-10-CM | POA: Diagnosis not present

## 2021-01-31 DIAGNOSIS — M4307 Spondylolysis, lumbosacral region: Secondary | ICD-10-CM | POA: Diagnosis not present

## 2021-02-18 DIAGNOSIS — M47894 Other spondylosis, thoracic region: Secondary | ICD-10-CM | POA: Diagnosis not present

## 2021-02-18 DIAGNOSIS — M9902 Segmental and somatic dysfunction of thoracic region: Secondary | ICD-10-CM | POA: Diagnosis not present

## 2021-02-18 DIAGNOSIS — M9901 Segmental and somatic dysfunction of cervical region: Secondary | ICD-10-CM | POA: Diagnosis not present

## 2021-02-18 DIAGNOSIS — M4307 Spondylolysis, lumbosacral region: Secondary | ICD-10-CM | POA: Diagnosis not present

## 2021-02-18 DIAGNOSIS — M4723 Other spondylosis with radiculopathy, cervicothoracic region: Secondary | ICD-10-CM | POA: Diagnosis not present

## 2021-02-18 DIAGNOSIS — M9903 Segmental and somatic dysfunction of lumbar region: Secondary | ICD-10-CM | POA: Diagnosis not present

## 2021-03-19 DIAGNOSIS — M4307 Spondylolysis, lumbosacral region: Secondary | ICD-10-CM | POA: Diagnosis not present

## 2021-03-19 DIAGNOSIS — M5388 Other specified dorsopathies, sacral and sacrococcygeal region: Secondary | ICD-10-CM | POA: Diagnosis not present

## 2021-03-19 DIAGNOSIS — M9903 Segmental and somatic dysfunction of lumbar region: Secondary | ICD-10-CM | POA: Diagnosis not present

## 2021-03-19 DIAGNOSIS — M9904 Segmental and somatic dysfunction of sacral region: Secondary | ICD-10-CM | POA: Diagnosis not present

## 2021-03-19 DIAGNOSIS — M47894 Other spondylosis, thoracic region: Secondary | ICD-10-CM | POA: Diagnosis not present

## 2021-03-19 DIAGNOSIS — M9902 Segmental and somatic dysfunction of thoracic region: Secondary | ICD-10-CM | POA: Diagnosis not present

## 2021-04-11 DIAGNOSIS — M4307 Spondylolysis, lumbosacral region: Secondary | ICD-10-CM | POA: Diagnosis not present

## 2021-04-11 DIAGNOSIS — M47894 Other spondylosis, thoracic region: Secondary | ICD-10-CM | POA: Diagnosis not present

## 2021-04-11 DIAGNOSIS — M5388 Other specified dorsopathies, sacral and sacrococcygeal region: Secondary | ICD-10-CM | POA: Diagnosis not present

## 2021-04-11 DIAGNOSIS — M9904 Segmental and somatic dysfunction of sacral region: Secondary | ICD-10-CM | POA: Diagnosis not present

## 2021-04-11 DIAGNOSIS — M9902 Segmental and somatic dysfunction of thoracic region: Secondary | ICD-10-CM | POA: Diagnosis not present

## 2021-04-11 DIAGNOSIS — M9903 Segmental and somatic dysfunction of lumbar region: Secondary | ICD-10-CM | POA: Diagnosis not present

## 2021-04-30 DIAGNOSIS — H43313 Vitreous membranes and strands, bilateral: Secondary | ICD-10-CM | POA: Diagnosis not present

## 2021-05-14 DIAGNOSIS — M9903 Segmental and somatic dysfunction of lumbar region: Secondary | ICD-10-CM | POA: Diagnosis not present

## 2021-05-14 DIAGNOSIS — M4307 Spondylolysis, lumbosacral region: Secondary | ICD-10-CM | POA: Diagnosis not present

## 2021-05-14 DIAGNOSIS — M9901 Segmental and somatic dysfunction of cervical region: Secondary | ICD-10-CM | POA: Diagnosis not present

## 2021-05-14 DIAGNOSIS — M9902 Segmental and somatic dysfunction of thoracic region: Secondary | ICD-10-CM | POA: Diagnosis not present

## 2021-05-14 DIAGNOSIS — M47894 Other spondylosis, thoracic region: Secondary | ICD-10-CM | POA: Diagnosis not present

## 2021-05-14 DIAGNOSIS — M47892 Other spondylosis, cervical region: Secondary | ICD-10-CM | POA: Diagnosis not present

## 2021-07-21 DIAGNOSIS — M9904 Segmental and somatic dysfunction of sacral region: Secondary | ICD-10-CM | POA: Diagnosis not present

## 2021-07-21 DIAGNOSIS — M4307 Spondylolysis, lumbosacral region: Secondary | ICD-10-CM | POA: Diagnosis not present

## 2021-07-21 DIAGNOSIS — M5388 Other specified dorsopathies, sacral and sacrococcygeal region: Secondary | ICD-10-CM | POA: Diagnosis not present

## 2021-07-21 DIAGNOSIS — M9903 Segmental and somatic dysfunction of lumbar region: Secondary | ICD-10-CM | POA: Diagnosis not present

## 2021-07-21 DIAGNOSIS — M47892 Other spondylosis, cervical region: Secondary | ICD-10-CM | POA: Diagnosis not present

## 2021-07-21 DIAGNOSIS — M9901 Segmental and somatic dysfunction of cervical region: Secondary | ICD-10-CM | POA: Diagnosis not present

## 2021-08-26 DIAGNOSIS — M9903 Segmental and somatic dysfunction of lumbar region: Secondary | ICD-10-CM | POA: Diagnosis not present

## 2021-08-26 DIAGNOSIS — M9904 Segmental and somatic dysfunction of sacral region: Secondary | ICD-10-CM | POA: Diagnosis not present

## 2021-08-26 DIAGNOSIS — M9901 Segmental and somatic dysfunction of cervical region: Secondary | ICD-10-CM | POA: Diagnosis not present

## 2021-08-26 DIAGNOSIS — M5388 Other specified dorsopathies, sacral and sacrococcygeal region: Secondary | ICD-10-CM | POA: Diagnosis not present

## 2021-08-26 DIAGNOSIS — M4307 Spondylolysis, lumbosacral region: Secondary | ICD-10-CM | POA: Diagnosis not present

## 2021-08-26 DIAGNOSIS — M47892 Other spondylosis, cervical region: Secondary | ICD-10-CM | POA: Diagnosis not present

## 2021-09-29 DIAGNOSIS — M9901 Segmental and somatic dysfunction of cervical region: Secondary | ICD-10-CM | POA: Diagnosis not present

## 2021-09-29 DIAGNOSIS — M47892 Other spondylosis, cervical region: Secondary | ICD-10-CM | POA: Diagnosis not present

## 2021-09-29 DIAGNOSIS — M9904 Segmental and somatic dysfunction of sacral region: Secondary | ICD-10-CM | POA: Diagnosis not present

## 2021-09-29 DIAGNOSIS — M9903 Segmental and somatic dysfunction of lumbar region: Secondary | ICD-10-CM | POA: Diagnosis not present

## 2021-09-29 DIAGNOSIS — M5388 Other specified dorsopathies, sacral and sacrococcygeal region: Secondary | ICD-10-CM | POA: Diagnosis not present

## 2021-09-29 DIAGNOSIS — M4307 Spondylolysis, lumbosacral region: Secondary | ICD-10-CM | POA: Diagnosis not present

## 2021-10-29 DIAGNOSIS — M47814 Spondylosis without myelopathy or radiculopathy, thoracic region: Secondary | ICD-10-CM | POA: Diagnosis not present

## 2021-10-29 DIAGNOSIS — M9902 Segmental and somatic dysfunction of thoracic region: Secondary | ICD-10-CM | POA: Diagnosis not present

## 2021-10-29 DIAGNOSIS — M47892 Other spondylosis, cervical region: Secondary | ICD-10-CM | POA: Diagnosis not present

## 2021-10-29 DIAGNOSIS — M9901 Segmental and somatic dysfunction of cervical region: Secondary | ICD-10-CM | POA: Diagnosis not present

## 2021-10-29 DIAGNOSIS — M4307 Spondylolysis, lumbosacral region: Secondary | ICD-10-CM | POA: Diagnosis not present

## 2021-10-29 DIAGNOSIS — M9903 Segmental and somatic dysfunction of lumbar region: Secondary | ICD-10-CM | POA: Diagnosis not present

## 2021-11-26 DIAGNOSIS — H903 Sensorineural hearing loss, bilateral: Secondary | ICD-10-CM | POA: Diagnosis not present

## 2021-11-30 DIAGNOSIS — M9903 Segmental and somatic dysfunction of lumbar region: Secondary | ICD-10-CM | POA: Diagnosis not present

## 2021-11-30 DIAGNOSIS — M4307 Spondylolysis, lumbosacral region: Secondary | ICD-10-CM | POA: Diagnosis not present

## 2021-11-30 DIAGNOSIS — M47814 Spondylosis without myelopathy or radiculopathy, thoracic region: Secondary | ICD-10-CM | POA: Diagnosis not present

## 2021-11-30 DIAGNOSIS — M9902 Segmental and somatic dysfunction of thoracic region: Secondary | ICD-10-CM | POA: Diagnosis not present

## 2021-11-30 DIAGNOSIS — M47892 Other spondylosis, cervical region: Secondary | ICD-10-CM | POA: Diagnosis not present

## 2021-11-30 DIAGNOSIS — M9901 Segmental and somatic dysfunction of cervical region: Secondary | ICD-10-CM | POA: Diagnosis not present

## 2021-12-01 DIAGNOSIS — M47814 Spondylosis without myelopathy or radiculopathy, thoracic region: Secondary | ICD-10-CM | POA: Diagnosis not present

## 2021-12-01 DIAGNOSIS — M4307 Spondylolysis, lumbosacral region: Secondary | ICD-10-CM | POA: Diagnosis not present

## 2021-12-01 DIAGNOSIS — M9903 Segmental and somatic dysfunction of lumbar region: Secondary | ICD-10-CM | POA: Diagnosis not present

## 2021-12-01 DIAGNOSIS — M9902 Segmental and somatic dysfunction of thoracic region: Secondary | ICD-10-CM | POA: Diagnosis not present

## 2021-12-01 DIAGNOSIS — M47892 Other spondylosis, cervical region: Secondary | ICD-10-CM | POA: Diagnosis not present

## 2021-12-01 DIAGNOSIS — M9901 Segmental and somatic dysfunction of cervical region: Secondary | ICD-10-CM | POA: Diagnosis not present

## 2021-12-25 DIAGNOSIS — H903 Sensorineural hearing loss, bilateral: Secondary | ICD-10-CM | POA: Diagnosis not present

## 2022-01-29 DIAGNOSIS — H903 Sensorineural hearing loss, bilateral: Secondary | ICD-10-CM | POA: Diagnosis not present

## 2022-01-30 DIAGNOSIS — M9902 Segmental and somatic dysfunction of thoracic region: Secondary | ICD-10-CM | POA: Diagnosis not present

## 2022-01-30 DIAGNOSIS — M47814 Spondylosis without myelopathy or radiculopathy, thoracic region: Secondary | ICD-10-CM | POA: Diagnosis not present

## 2022-01-30 DIAGNOSIS — M4307 Spondylolysis, lumbosacral region: Secondary | ICD-10-CM | POA: Diagnosis not present

## 2022-01-30 DIAGNOSIS — M9901 Segmental and somatic dysfunction of cervical region: Secondary | ICD-10-CM | POA: Diagnosis not present

## 2022-01-30 DIAGNOSIS — M9903 Segmental and somatic dysfunction of lumbar region: Secondary | ICD-10-CM | POA: Diagnosis not present

## 2022-01-30 DIAGNOSIS — M47893 Other spondylosis, cervicothoracic region: Secondary | ICD-10-CM | POA: Diagnosis not present

## 2022-03-04 DIAGNOSIS — M9901 Segmental and somatic dysfunction of cervical region: Secondary | ICD-10-CM | POA: Diagnosis not present

## 2022-03-04 DIAGNOSIS — M47814 Spondylosis without myelopathy or radiculopathy, thoracic region: Secondary | ICD-10-CM | POA: Diagnosis not present

## 2022-03-04 DIAGNOSIS — M9902 Segmental and somatic dysfunction of thoracic region: Secondary | ICD-10-CM | POA: Diagnosis not present

## 2022-03-04 DIAGNOSIS — M4307 Spondylolysis, lumbosacral region: Secondary | ICD-10-CM | POA: Diagnosis not present

## 2022-03-04 DIAGNOSIS — M47893 Other spondylosis, cervicothoracic region: Secondary | ICD-10-CM | POA: Diagnosis not present

## 2022-03-04 DIAGNOSIS — M9903 Segmental and somatic dysfunction of lumbar region: Secondary | ICD-10-CM | POA: Diagnosis not present

## 2022-04-01 DIAGNOSIS — M4307 Spondylolysis, lumbosacral region: Secondary | ICD-10-CM | POA: Diagnosis not present

## 2022-04-01 DIAGNOSIS — M5388 Other specified dorsopathies, sacral and sacrococcygeal region: Secondary | ICD-10-CM | POA: Diagnosis not present

## 2022-04-01 DIAGNOSIS — M9904 Segmental and somatic dysfunction of sacral region: Secondary | ICD-10-CM | POA: Diagnosis not present

## 2022-04-01 DIAGNOSIS — M47893 Other spondylosis, cervicothoracic region: Secondary | ICD-10-CM | POA: Diagnosis not present

## 2022-04-01 DIAGNOSIS — M9903 Segmental and somatic dysfunction of lumbar region: Secondary | ICD-10-CM | POA: Diagnosis not present

## 2022-04-01 DIAGNOSIS — M9901 Segmental and somatic dysfunction of cervical region: Secondary | ICD-10-CM | POA: Diagnosis not present

## 2022-05-06 DIAGNOSIS — M5388 Other specified dorsopathies, sacral and sacrococcygeal region: Secondary | ICD-10-CM | POA: Diagnosis not present

## 2022-05-06 DIAGNOSIS — M47893 Other spondylosis, cervicothoracic region: Secondary | ICD-10-CM | POA: Diagnosis not present

## 2022-05-06 DIAGNOSIS — M4307 Spondylolysis, lumbosacral region: Secondary | ICD-10-CM | POA: Diagnosis not present

## 2022-05-06 DIAGNOSIS — M9903 Segmental and somatic dysfunction of lumbar region: Secondary | ICD-10-CM | POA: Diagnosis not present

## 2022-05-06 DIAGNOSIS — M9904 Segmental and somatic dysfunction of sacral region: Secondary | ICD-10-CM | POA: Diagnosis not present

## 2022-05-06 DIAGNOSIS — M9901 Segmental and somatic dysfunction of cervical region: Secondary | ICD-10-CM | POA: Diagnosis not present

## 2022-05-19 DIAGNOSIS — H43313 Vitreous membranes and strands, bilateral: Secondary | ICD-10-CM | POA: Diagnosis not present

## 2022-06-04 DIAGNOSIS — M4307 Spondylolysis, lumbosacral region: Secondary | ICD-10-CM | POA: Diagnosis not present

## 2022-06-04 DIAGNOSIS — M9901 Segmental and somatic dysfunction of cervical region: Secondary | ICD-10-CM | POA: Diagnosis not present

## 2022-06-04 DIAGNOSIS — M9903 Segmental and somatic dysfunction of lumbar region: Secondary | ICD-10-CM | POA: Diagnosis not present

## 2022-06-04 DIAGNOSIS — M5388 Other specified dorsopathies, sacral and sacrococcygeal region: Secondary | ICD-10-CM | POA: Diagnosis not present

## 2022-06-04 DIAGNOSIS — M9904 Segmental and somatic dysfunction of sacral region: Secondary | ICD-10-CM | POA: Diagnosis not present

## 2022-06-04 DIAGNOSIS — M47893 Other spondylosis, cervicothoracic region: Secondary | ICD-10-CM | POA: Diagnosis not present

## 2022-07-08 DIAGNOSIS — M4307 Spondylolysis, lumbosacral region: Secondary | ICD-10-CM | POA: Diagnosis not present

## 2022-07-08 DIAGNOSIS — M47813 Spondylosis without myelopathy or radiculopathy, cervicothoracic region: Secondary | ICD-10-CM | POA: Diagnosis not present

## 2022-07-08 DIAGNOSIS — M47893 Other spondylosis, cervicothoracic region: Secondary | ICD-10-CM | POA: Diagnosis not present

## 2022-07-08 DIAGNOSIS — M9901 Segmental and somatic dysfunction of cervical region: Secondary | ICD-10-CM | POA: Diagnosis not present

## 2022-07-08 DIAGNOSIS — M9902 Segmental and somatic dysfunction of thoracic region: Secondary | ICD-10-CM | POA: Diagnosis not present

## 2022-07-08 DIAGNOSIS — M9903 Segmental and somatic dysfunction of lumbar region: Secondary | ICD-10-CM | POA: Diagnosis not present

## 2022-08-24 DIAGNOSIS — M9903 Segmental and somatic dysfunction of lumbar region: Secondary | ICD-10-CM | POA: Diagnosis not present

## 2022-08-24 DIAGNOSIS — M4727 Other spondylosis with radiculopathy, lumbosacral region: Secondary | ICD-10-CM | POA: Diagnosis not present

## 2022-08-24 DIAGNOSIS — M5388 Other specified dorsopathies, sacral and sacrococcygeal region: Secondary | ICD-10-CM | POA: Diagnosis not present

## 2022-08-24 DIAGNOSIS — M9904 Segmental and somatic dysfunction of sacral region: Secondary | ICD-10-CM | POA: Diagnosis not present

## 2022-08-24 DIAGNOSIS — M47894 Other spondylosis, thoracic region: Secondary | ICD-10-CM | POA: Diagnosis not present

## 2022-08-24 DIAGNOSIS — M9902 Segmental and somatic dysfunction of thoracic region: Secondary | ICD-10-CM | POA: Diagnosis not present

## 2022-09-28 DIAGNOSIS — M4727 Other spondylosis with radiculopathy, lumbosacral region: Secondary | ICD-10-CM | POA: Diagnosis not present

## 2022-09-28 DIAGNOSIS — M9904 Segmental and somatic dysfunction of sacral region: Secondary | ICD-10-CM | POA: Diagnosis not present

## 2022-09-28 DIAGNOSIS — M9903 Segmental and somatic dysfunction of lumbar region: Secondary | ICD-10-CM | POA: Diagnosis not present

## 2022-09-28 DIAGNOSIS — M9902 Segmental and somatic dysfunction of thoracic region: Secondary | ICD-10-CM | POA: Diagnosis not present

## 2022-09-28 DIAGNOSIS — M5388 Other specified dorsopathies, sacral and sacrococcygeal region: Secondary | ICD-10-CM | POA: Diagnosis not present

## 2022-09-28 DIAGNOSIS — M47894 Other spondylosis, thoracic region: Secondary | ICD-10-CM | POA: Diagnosis not present

## 2022-10-28 DIAGNOSIS — M47812 Spondylosis without myelopathy or radiculopathy, cervical region: Secondary | ICD-10-CM | POA: Diagnosis not present

## 2022-10-28 DIAGNOSIS — M47894 Other spondylosis, thoracic region: Secondary | ICD-10-CM | POA: Diagnosis not present

## 2022-10-28 DIAGNOSIS — M9904 Segmental and somatic dysfunction of sacral region: Secondary | ICD-10-CM | POA: Diagnosis not present

## 2022-10-28 DIAGNOSIS — M5388 Other specified dorsopathies, sacral and sacrococcygeal region: Secondary | ICD-10-CM | POA: Diagnosis not present

## 2022-10-28 DIAGNOSIS — M9903 Segmental and somatic dysfunction of lumbar region: Secondary | ICD-10-CM | POA: Diagnosis not present

## 2022-10-28 DIAGNOSIS — M9902 Segmental and somatic dysfunction of thoracic region: Secondary | ICD-10-CM | POA: Diagnosis not present

## 2022-11-25 DIAGNOSIS — M9902 Segmental and somatic dysfunction of thoracic region: Secondary | ICD-10-CM | POA: Diagnosis not present

## 2022-11-25 DIAGNOSIS — M9901 Segmental and somatic dysfunction of cervical region: Secondary | ICD-10-CM | POA: Diagnosis not present

## 2022-11-25 DIAGNOSIS — M9903 Segmental and somatic dysfunction of lumbar region: Secondary | ICD-10-CM | POA: Diagnosis not present

## 2022-11-25 DIAGNOSIS — M47812 Spondylosis without myelopathy or radiculopathy, cervical region: Secondary | ICD-10-CM | POA: Diagnosis not present

## 2022-11-25 DIAGNOSIS — M47894 Other spondylosis, thoracic region: Secondary | ICD-10-CM | POA: Diagnosis not present

## 2022-12-30 DIAGNOSIS — M9903 Segmental and somatic dysfunction of lumbar region: Secondary | ICD-10-CM | POA: Diagnosis not present

## 2022-12-30 DIAGNOSIS — M5388 Other specified dorsopathies, sacral and sacrococcygeal region: Secondary | ICD-10-CM | POA: Diagnosis not present

## 2022-12-30 DIAGNOSIS — M9901 Segmental and somatic dysfunction of cervical region: Secondary | ICD-10-CM | POA: Diagnosis not present

## 2022-12-30 DIAGNOSIS — M47812 Spondylosis without myelopathy or radiculopathy, cervical region: Secondary | ICD-10-CM | POA: Diagnosis not present

## 2022-12-30 DIAGNOSIS — M9904 Segmental and somatic dysfunction of sacral region: Secondary | ICD-10-CM | POA: Diagnosis not present

## 2023-02-01 DIAGNOSIS — M9904 Segmental and somatic dysfunction of sacral region: Secondary | ICD-10-CM | POA: Diagnosis not present

## 2023-02-01 DIAGNOSIS — M5388 Other specified dorsopathies, sacral and sacrococcygeal region: Secondary | ICD-10-CM | POA: Diagnosis not present

## 2023-02-01 DIAGNOSIS — M47812 Spondylosis without myelopathy or radiculopathy, cervical region: Secondary | ICD-10-CM | POA: Diagnosis not present

## 2023-02-01 DIAGNOSIS — M9901 Segmental and somatic dysfunction of cervical region: Secondary | ICD-10-CM | POA: Diagnosis not present

## 2023-02-01 DIAGNOSIS — M9903 Segmental and somatic dysfunction of lumbar region: Secondary | ICD-10-CM | POA: Diagnosis not present

## 2023-03-03 DIAGNOSIS — M9903 Segmental and somatic dysfunction of lumbar region: Secondary | ICD-10-CM | POA: Diagnosis not present

## 2023-03-03 DIAGNOSIS — M47812 Spondylosis without myelopathy or radiculopathy, cervical region: Secondary | ICD-10-CM | POA: Diagnosis not present

## 2023-03-03 DIAGNOSIS — M9901 Segmental and somatic dysfunction of cervical region: Secondary | ICD-10-CM | POA: Diagnosis not present

## 2023-03-03 DIAGNOSIS — M9904 Segmental and somatic dysfunction of sacral region: Secondary | ICD-10-CM | POA: Diagnosis not present

## 2023-03-03 DIAGNOSIS — M5388 Other specified dorsopathies, sacral and sacrococcygeal region: Secondary | ICD-10-CM | POA: Diagnosis not present

## 2023-03-03 DIAGNOSIS — M47816 Spondylosis without myelopathy or radiculopathy, lumbar region: Secondary | ICD-10-CM | POA: Diagnosis not present

## 2023-04-08 DIAGNOSIS — M9902 Segmental and somatic dysfunction of thoracic region: Secondary | ICD-10-CM | POA: Diagnosis not present

## 2023-04-08 DIAGNOSIS — M5388 Other specified dorsopathies, sacral and sacrococcygeal region: Secondary | ICD-10-CM | POA: Diagnosis not present

## 2023-04-08 DIAGNOSIS — M9901 Segmental and somatic dysfunction of cervical region: Secondary | ICD-10-CM | POA: Diagnosis not present

## 2023-04-08 DIAGNOSIS — M47812 Spondylosis without myelopathy or radiculopathy, cervical region: Secondary | ICD-10-CM | POA: Diagnosis not present

## 2023-04-08 DIAGNOSIS — M9904 Segmental and somatic dysfunction of sacral region: Secondary | ICD-10-CM | POA: Diagnosis not present

## 2023-04-08 DIAGNOSIS — M47813 Spondylosis without myelopathy or radiculopathy, cervicothoracic region: Secondary | ICD-10-CM | POA: Diagnosis not present

## 2023-05-10 DIAGNOSIS — M5388 Other specified dorsopathies, sacral and sacrococcygeal region: Secondary | ICD-10-CM | POA: Diagnosis not present

## 2023-05-10 DIAGNOSIS — M9902 Segmental and somatic dysfunction of thoracic region: Secondary | ICD-10-CM | POA: Diagnosis not present

## 2023-05-10 DIAGNOSIS — M9901 Segmental and somatic dysfunction of cervical region: Secondary | ICD-10-CM | POA: Diagnosis not present

## 2023-05-10 DIAGNOSIS — M47812 Spondylosis without myelopathy or radiculopathy, cervical region: Secondary | ICD-10-CM | POA: Diagnosis not present

## 2023-05-10 DIAGNOSIS — M9904 Segmental and somatic dysfunction of sacral region: Secondary | ICD-10-CM | POA: Diagnosis not present

## 2023-05-10 DIAGNOSIS — M47813 Spondylosis without myelopathy or radiculopathy, cervicothoracic region: Secondary | ICD-10-CM | POA: Diagnosis not present

## 2023-05-28 DIAGNOSIS — R32 Unspecified urinary incontinence: Secondary | ICD-10-CM | POA: Diagnosis not present

## 2023-05-28 DIAGNOSIS — N814 Uterovaginal prolapse, unspecified: Secondary | ICD-10-CM | POA: Diagnosis not present

## 2023-05-28 DIAGNOSIS — R030 Elevated blood-pressure reading, without diagnosis of hypertension: Secondary | ICD-10-CM | POA: Diagnosis not present

## 2023-06-11 ENCOUNTER — Encounter: Payer: Self-pay | Admitting: Obstetrics and Gynecology

## 2023-06-11 ENCOUNTER — Ambulatory Visit (INDEPENDENT_AMBULATORY_CARE_PROVIDER_SITE_OTHER): Payer: Medicare PPO | Admitting: Obstetrics and Gynecology

## 2023-06-11 VITALS — BP 153/94 | HR 79 | Ht 60.0 in | Wt 138.4 lb

## 2023-06-11 DIAGNOSIS — R350 Frequency of micturition: Secondary | ICD-10-CM | POA: Diagnosis not present

## 2023-06-11 DIAGNOSIS — N393 Stress incontinence (female) (male): Secondary | ICD-10-CM | POA: Diagnosis not present

## 2023-06-11 DIAGNOSIS — N816 Rectocele: Secondary | ICD-10-CM

## 2023-06-11 DIAGNOSIS — N8110 Cystocele, unspecified: Secondary | ICD-10-CM

## 2023-06-11 DIAGNOSIS — N814 Uterovaginal prolapse, unspecified: Secondary | ICD-10-CM | POA: Diagnosis not present

## 2023-06-11 LAB — POCT URINALYSIS DIPSTICK
Bilirubin, UA: NEGATIVE
Blood, UA: NEGATIVE
Glucose, UA: NEGATIVE
Ketones, UA: NEGATIVE
Nitrite, UA: NEGATIVE
Protein, UA: NEGATIVE
Spec Grav, UA: 1.02 (ref 1.010–1.025)
Urobilinogen, UA: 0.2 E.U./dL
pH, UA: 7 (ref 5.0–8.0)

## 2023-06-11 NOTE — Progress Notes (Addendum)
[facility] New Patient Evaluation and Consultation  Referring Provider: Ranae Pila, * PCP: Patient, No Pcp Per Date of Service: 06/11/2023  SUBJECTIVE Chief Complaint: New Patient (Initial Visit) (Morgan Gibbs is a 77 y.o. female is here for uterine prolapse.)  History of Present Illness: Morgan Gibbs is a 77 y.o. White or Caucasian female seen in consultation at the request of Dr. Elon Spanner for evaluation of prolapsed uterus.    Review of records significant for: Reports she has had a bladder tack.   Urinary Symptoms: Does not leak urine.    Day time voids 8.  Nocturia: 0-1 times per night to void. Voiding dysfunction: she does not empty her bladder well.  does not use a catheter to empty bladder.  When urinating, she feels the need to urinate multiple times in a row and to push on her belly or vagina to empty bladder   UTIs: 0 UTI's in the last year.   Denies history of blood in urine, kidney or bladder stones, pyelonephritis, bladder cancer, and kidney cancer  Pelvic Organ Prolapse Symptoms:                  She Admits to a feeling of a bulge the vaginal area. It has been present for 2 years.  She Admits to seeing a bulge.  This bulge is bothersome.  Bowel Symptom: Bowel movements: 1 time(s) per day Stool consistency: soft  Straining: no.  Splinting: no.  Incomplete evacuation: no.  She Denies accidental bowel leakage / fecal incontinence Bowel regimen:  Magnesium and digest gold Last colonoscopy: Date 2015, Results WNL  Sexual Function Sexually active: yes.  Sexual orientation: Straight Pain with sex: No  Pelvic Pain Denies pelvic pain    Past Medical History:  Past Medical History:  Diagnosis Date   Thyroid condition 2018     Past Surgical History:   Past Surgical History:  Procedure Laterality Date   Bladder tact       Past OB/GYN History: G5 P4 Vaginal deliveries: 4,  Forceps/ Vacuum deliveries:  first one may have had forceps, Cesarean section: 0 Menopausal: Yes, at age 35 Last pap smear was 2018.  Any history of abnormal pap smears: no.   Medications: She has a current medication list which includes the following prescription(s): clindamycin and hydrocodone-acetaminophen.   Allergies: Patient has No Known Allergies.   Social History:  Social History   Tobacco Use   Smoking status: Never   Smokeless tobacco: Never  Vaping Use   Vaping status: Never Used  Substance Use Topics   Alcohol use: No   Drug use: No    Relationship status: married She lives with husband.   She is not employed. Regular exercise: Yes: Gym, walking, stretches History of abuse: No  Family History:   Family History  Problem Relation Age of Onset   Dementia Mother    Lung cancer Father    Brain cancer Sister    Colon cancer Sister      Review of Systems: Review of Systems  Constitutional:  Negative for chills, fever and malaise/fatigue.  Respiratory:  Negative for cough, shortness of breath and wheezing.   Cardiovascular:  Negative for chest pain, palpitations and leg swelling.  Gastrointestinal:  Negative for abdominal pain, blood in stool and constipation.  Genitourinary:  Negative for dysuria, flank pain, hematuria and urgency.  Musculoskeletal:  Negative for myalgias.  Neurological:  Negative for dizziness, weakness and headaches.  Endo/Heme/Allergies:  Does not bruise/bleed  easily.       +Hot Flashes  Psychiatric/Behavioral:  Negative for depression and suicidal ideas. The patient is not nervous/anxious.      OBJECTIVE Physical Exam: Vitals:   06/11/23 0820 06/11/23 0830  BP: (!) 161/94 (!) 153/94  Pulse: 79 79  Weight:  138 lb 6.4 oz (62.8 kg)  Height:  5' (1.524 m)    Physical Exam Constitutional:      Appearance: Normal appearance. She is normal weight.  Pulmonary:     Effort: Pulmonary effort is normal.  Abdominal:     General: Abdomen is flat.  Skin:     General: Skin is warm and dry.  Neurological:     Mental Status: She is alert and oriented to person, place, and time.  Psychiatric:        Mood and Affect: Mood normal.        Behavior: Behavior normal.        Thought Content: Thought content normal.        Judgment: Judgment normal.      GU / Detailed Urogynecologic Evaluation:  Pelvic Exam: Normal external female genitalia; Bartholin's and Skene's glands normal in appearance; urethral meatus normal in appearance, no urethral masses or discharge.   CST: negative    Speculum exam reveals normal vaginal mucosa with atrophy. Cervix normal appearance. Uterus normal single, nontender. Adnexa normal adnexa.    With apex supported, anterior compartment defect was present  Pelvic floor strength II/V  Pelvic floor musculature: Right levator non-tender, Right obturator non-tender, Left levator non-tender, Left obturator non-tender  POP-Q:   POP-Q  -1.5                                            Aa   -1.5                                           Ba  -5.5                                              C   5                                            Gh  4.5                                            Pb  10                                            tvl   -0.5                                            Ap  -  0.5                                            Bp  -8                                              D      Rectal Exam:  Normal external exam  Post-Void Residual (PVR) by Bladder Scan: In order to evaluate bladder emptying, we discussed obtaining a postvoid residual and she agreed to this procedure.  Procedure: The ultrasound unit was placed on the patient's abdomen in the suprapubic region after the patient had voided. A PVR of 37 ml was obtained by bladder scan.  Laboratory Results: POC Urine: Trace leukocytes, negative for all other signs of infection     ASSESSMENT AND PLAN Morgan Gibbs is a 77 y.o.  with:  1. Prolapse of posterior vaginal wall   2. Uterine prolapse   3. Prolapse of anterior vaginal wall   4. SUI (stress urinary incontinence, female)   5. Urinary frequency    POP:  Patient has stage I-II/IV Anterior vaginal prolapse II/IV Uterine Prolapse, and II/IV posterior vaginal wall prolapse. For treatment of pelvic organ prolapse, we discussed options for management including expectant management, conservative management, and surgical management, such as Kegels, a pessary, pelvic floor physical therapy, and specific surgical procedures. She is not interested in a pessary at this time, as she feels she may have issues as she ages. She is interested in surgical repair. We discussed options for repair including robotic repair and vaginal repair options. She is leaning more towards vaginal procedure as her sister had a complication related to mesh. She will need to undergo Urodynamics testing and then meet with the surgeon for surgical planning.   SUI:  Patient reports that prior to her prolapse she was having bladder leakage. We discussed that with prolapse repair it can cause increased leakage that can be addressed during surgical repair. We very briefly discussed options including Mid-Urethral sling and urethral bulking. She will decide further after urodynamics testing and meeting with the surgeon.   UUI: Unsure if she is having overactive bladder symptoms or more-so symptoms related to not emptying the bladder. Will evaluate this further with urodynamics as well, and if she needs a medication we can consider adding this in.    Patient to return for Urodynamics testing.    Selmer Dominion, NP

## 2023-06-11 NOTE — Patient Instructions (Addendum)
Consider all your options for surgery.   We will get you scheduled for bladder testing and then follow up with Dr. Florian Buff.  You have stage I/IV Anterior vaginal prolapse II/IV Uterine Prolapse, and III/IV posterior vaginal wall prolapse  We discussed two options for prolapse repair:  1) vaginal repair without mesh - Pros - safer, no mesh complications - Cons - not as strong as mesh repair, higher risk of recurrence  2) laparoscopic repair with mesh - Pros - stronger, better long-term success - Cons - risks of mesh implant (erosion into vagina or bladder, adhering to the rectum, pain) - these risks are lower than with a vaginal mesh but still exist

## 2023-06-17 DIAGNOSIS — M9902 Segmental and somatic dysfunction of thoracic region: Secondary | ICD-10-CM | POA: Diagnosis not present

## 2023-06-17 DIAGNOSIS — M9903 Segmental and somatic dysfunction of lumbar region: Secondary | ICD-10-CM | POA: Diagnosis not present

## 2023-06-17 DIAGNOSIS — M47892 Other spondylosis, cervical region: Secondary | ICD-10-CM | POA: Diagnosis not present

## 2023-06-17 DIAGNOSIS — M47814 Spondylosis without myelopathy or radiculopathy, thoracic region: Secondary | ICD-10-CM | POA: Diagnosis not present

## 2023-06-17 DIAGNOSIS — M47817 Spondylosis without myelopathy or radiculopathy, lumbosacral region: Secondary | ICD-10-CM | POA: Diagnosis not present

## 2023-06-17 DIAGNOSIS — M9901 Segmental and somatic dysfunction of cervical region: Secondary | ICD-10-CM | POA: Diagnosis not present

## 2023-06-30 ENCOUNTER — Encounter: Payer: Self-pay | Admitting: Obstetrics and Gynecology

## 2023-06-30 ENCOUNTER — Ambulatory Visit (INDEPENDENT_AMBULATORY_CARE_PROVIDER_SITE_OTHER): Payer: Medicare PPO | Admitting: Obstetrics and Gynecology

## 2023-06-30 VITALS — BP 137/81 | HR 56

## 2023-06-30 DIAGNOSIS — N8110 Cystocele, unspecified: Secondary | ICD-10-CM

## 2023-06-30 DIAGNOSIS — N816 Rectocele: Secondary | ICD-10-CM

## 2023-06-30 DIAGNOSIS — R350 Frequency of micturition: Secondary | ICD-10-CM

## 2023-06-30 DIAGNOSIS — N814 Uterovaginal prolapse, unspecified: Secondary | ICD-10-CM

## 2023-06-30 DIAGNOSIS — N393 Stress incontinence (female) (male): Secondary | ICD-10-CM

## 2023-06-30 LAB — POCT URINALYSIS DIPSTICK
Bilirubin, UA: NEGATIVE
Blood, UA: NEGATIVE
Glucose, UA: NEGATIVE
Ketones, UA: NEGATIVE
Leukocytes, UA: NEGATIVE
Nitrite, UA: NEGATIVE
Protein, UA: NEGATIVE
Spec Grav, UA: <=1.005 — AB (ref 1.010–1.025)
Urobilinogen, UA: 0.2 E.U./dL
pH, UA: 6.5 (ref 5.0–8.0)

## 2023-06-30 NOTE — Patient Instructions (Signed)
Taking Care of Yourself after Urodynamics   Drink plenty of water for a day or two following your procedure. Try to have about 8 ounces (one cup) at a time, and do this 6 times or more per day unless you have fluid restrictitons AVOID irritative beverages such as coffee, tea, soda, alcoholic or citrus drinks for a day or two, as this may cause burning with urination. You may experience some discomfort or a burning sensation with urination after having this procedure. You can use over the counter Azo or pyridium to help with burning and follow the instructions on the packaging. If it does not improve within 1-2 days, or other symptoms appear (fever, chills, or difficulty urinating) call the office to speak to a nurse.  You may return to normal daily activities such as work, school, driving, exercising and housework on the day of the procedure.

## 2023-06-30 NOTE — Progress Notes (Signed)
[facility] Urodynamics Procedure  Referring Physician: No ref. provider found Date of Procedure: 06/30/2023  Morgan Gibbs is a 77 y.o. female who presents for urodynamic evaluation. Indication(s) for study: occult SUI  Vital Signs: BP 137/81   Pulse (!) 56   Laboratory Results: A catheterized urine specimen revealed:  POC urine: Negative for all components   Voiding Diary: Not Done  Procedure Timeout:  The correct patient was verified and the correct procedure was verified. The patient was in the correct position and safety precautions were reviewed based on at the patient's history.  Urodynamic Procedure A 12F dual lumen urodynamics catheter was placed under sterile conditions into the patient's bladder. A 12F catheter was placed into the rectum in order to measure abdominal pressure. EMG patches were placed in the appropriate position.  All connections were confirmed and calibrations/adjusted made. Saline was instilled into the bladder through the dual lumen catheters.  Cough/valsalva pressures were measured periodically during filling.  Patient was allowed to void.  The bladder was then emptied of its residual.  UROFLOW: Revealed a Qmax of 44.7 mL/sec.  She voided 798.4 mL and had a residual of 60 mL.  It was a interrupted pattern and represented normal habits.   CMG: This was performed with sterile water in the sitting position at a fill rate of 30 mL/min.    First sensation of fullness was 275 mLs,  First urge was 307 mLs,  Strong urge was 583 mLs and  Capacity was 612 mLs  Stress incontinence was demonstrated Highest positive Barrier CLPP was 155 cmH20 at 307 ml. Highest positive Barrier VLPP was 93 cmH20 at 307 ml.  Detrusor function was overactive, with phasic contractions seen.  The first occurred at 119 mL to 6.3 cm of water and was not associated with urge.  Compliance:  Normal. End fill detrusor pressure was 0 cmH20.    UPP: MUCP with  barrier reduction was 32 cm of water.    MICTURITION STUDY: Voiding was performed with reduction using scopettes in the sitting position.  Pdet at Qmax was 37.8 cm of water.  Qmax was 56.5 mL/sec.  It was a normal pattern.  She voided 897 mL and had a residual of 20 mL.  It was a volitional void, sustained detrusor contraction was present and abdominal straining was not present  EMG: This was performed with patches.  She had voluntary contractions, recruitment with fill was present and urethral sphincter was relaxed during the vast majority of the void, and then had some non-relaxation at the end of void.  The details of the procedure with the study tracings have been scanned into EPIC.   Urodynamic Impression:  1. Sensation was reduced; capacity was increased 2. Stress Incontinence was demonstrated at normal pressures; 3. Detrusor Overactivity was demonstrated without leakage. 4. Emptying was normal with a normal PVR, a sustained detrusor contraction present,  abdominal straining not present, mild dyssynergic urethral sphincter activity at the end of void on EMG.  Plan: - The patient will follow up  to discuss the findings and treatment options.

## 2023-07-13 ENCOUNTER — Encounter: Payer: Self-pay | Admitting: Obstetrics and Gynecology

## 2023-07-13 ENCOUNTER — Ambulatory Visit (INDEPENDENT_AMBULATORY_CARE_PROVIDER_SITE_OTHER): Payer: Medicare PPO | Admitting: Obstetrics and Gynecology

## 2023-07-13 VITALS — BP 150/90 | HR 62

## 2023-07-13 DIAGNOSIS — N8110 Cystocele, unspecified: Secondary | ICD-10-CM

## 2023-07-13 DIAGNOSIS — N813 Complete uterovaginal prolapse: Secondary | ICD-10-CM | POA: Diagnosis not present

## 2023-07-13 DIAGNOSIS — N393 Stress incontinence (female) (male): Secondary | ICD-10-CM | POA: Diagnosis not present

## 2023-07-13 DIAGNOSIS — N3281 Overactive bladder: Secondary | ICD-10-CM

## 2023-07-13 DIAGNOSIS — N816 Rectocele: Secondary | ICD-10-CM

## 2023-07-13 NOTE — Progress Notes (Signed)
[facility] Return Visit  SUBJECTIVE  History of Present Illness: Morgan Gibbs is a 77 y.o. female seen in follow-up for prolapse. She underwent urodynamic testing. She is interested in surgery. Her sister had complications from a procedure with mesh so she is not interested in any mesh or permanent implants. She would like to remain sexually active.   Urodynamic Impression:  1. Sensation was reduced; capacity was increased 2. Stress Incontinence was demonstrated at normal pressures; 3. Detrusor Overactivity was demonstrated without leakage. 4. Emptying was normal with a normal PVR, a sustained detrusor contraction present,  abdominal straining not present, mild dyssynergic urethral sphincter activity at the end of void on EMG.  Past Medical History: Patient  has a past medical history of Thyroid condition (2018).   Past Surgical History: She  has a past surgical history that includes Vaginal prolapse repair and Urethropexy.   Medications: She currently has no medications in their medication list.   Allergies: Patient has No Known Allergies.   Social History: Patient  reports that she has never smoked. She has never used smokeless tobacco. She reports that she does not drink alcohol and does not use drugs.      OBJECTIVE     Physical Exam: Vitals:   07/13/23 0803 07/13/23 0833  BP: (!) 150/87 (!) 150/90  Pulse: 65 62   Gen: No apparent distress, A&O x 3.  Detailed Urogynecologic Evaluation:  Normal external genitalia. On speculum, normal vaginal mucosa and normal appearing cervix. On bimanual, uterus is small, mobile and nontender.   POP-Q  3                                            Aa   4                                           Ba  4                                              C   5.5                                            Gh  4.5                                            Pb  6                                            tvl    3                                            Ap  4  Bp  -1.5                                              D      ASSESSMENT AND PLAN    Morgan Gibbs is a 77 y.o. with:  1. Uterovaginal prolapse, complete   2. Prolapse of anterior vaginal wall   3. Prolapse of posterior vaginal wall   4. SUI (stress urinary incontinence, female)   5. Overactive bladder     -She is not currently having symptomatic leakage.  - She would prefer to keep her ovaries unless they appear abnormal.   Plan for surgery: Exam under anesthesia, total vaginal hysterectomy, bilateral salpingectomy, uterosacral ligament suspension, anterior and posterior repair with perineorrhaphy, urethral bulking, cystoscopy  - We reviewed the patient's specific anatomic and functional findings, with the assistance of diagrams, and together finalized the above procedure. The planned surgical procedures were discussed along with the surgical risks outlined below, which were also provided on a detailed handout. Additional treatment options including expectant management, conservative management, medical management were discussed where appropriate.  We reviewed the benefits and risks of each treatment option.   General Surgical Risks: For all procedures, there are risks of bleeding, infection, damage to surrounding organs including but not limited to bowel, bladder, blood vessels, ureters and nerves, and need for further surgery if an injury were to occur. These risks are all low with minimally invasive surgery.   There are risks of numbness and weakness at any body site or buttock/rectal pain.  It is possible that baseline pain can be worsened by surgery, either with or without mesh. If surgery is vaginal, there is also a low risk of possible conversion to laparoscopy or open abdominal incision where indicated. Very rare risks include blood transfusion, blood clot, heart attack, pneumonia,  or death.   There is also a risk of short-term postoperative urinary retention with need to use a catheter. About half of patients need to go home from surgery with a catheter, which is then later removed in the office. The risk of long-term need for a catheter is very low. There is also a risk of worsening of overactive bladder.    Prolapse (with or without mesh): Risk factors for surgical failure  include things that put pressure on your pelvis and the surgical repair, including obesity, chronic cough, and heavy lifting or straining (including lifting children or adults, straining on the toilet, or lifting heavy objects such as furniture or anything weighing >25 lbs. Risks of recurrence is 20-30% with vaginal native tissue repair and a less than 10% with sacrocolpopexy with mesh.    Urethral bulking: We discussed success rate of approximately 70-80% and possible need for second injection. We reviewed that this is not a permanent procedure and the Bulkamid does dissolve over time. Risks reviewed including injury to bladder or urethra, UTI, urinary retention and hematuria.    - For preop Visit:  She is required to have a visit within 30 days of her surgery.    - Medical clearance: not required  - Anticoagulant use: No - Medicaid Hysterectomy form: No - Accepts blood transfusion: Yes - Expected length of stay: outpatient  Request sent for surgery scheduling.   Marguerita Beards, MD

## 2023-07-13 NOTE — Patient Instructions (Signed)
Plan for surgery: hysterectomy with removal of tubes, repair of vaginal walls, lifting up top of vagina (uterosacral suspension), urethral bulking (for incontinence), cystoscopy (to look into the bladder).

## 2023-07-16 DIAGNOSIS — M5432 Sciatica, left side: Secondary | ICD-10-CM | POA: Diagnosis not present

## 2023-07-16 DIAGNOSIS — M47894 Other spondylosis, thoracic region: Secondary | ICD-10-CM | POA: Diagnosis not present

## 2023-07-16 DIAGNOSIS — M4727 Other spondylosis with radiculopathy, lumbosacral region: Secondary | ICD-10-CM | POA: Diagnosis not present

## 2023-07-16 DIAGNOSIS — M9903 Segmental and somatic dysfunction of lumbar region: Secondary | ICD-10-CM | POA: Diagnosis not present

## 2023-07-16 DIAGNOSIS — M9902 Segmental and somatic dysfunction of thoracic region: Secondary | ICD-10-CM | POA: Diagnosis not present

## 2023-07-16 DIAGNOSIS — M9904 Segmental and somatic dysfunction of sacral region: Secondary | ICD-10-CM | POA: Diagnosis not present

## 2023-08-16 DIAGNOSIS — M47894 Other spondylosis, thoracic region: Secondary | ICD-10-CM | POA: Diagnosis not present

## 2023-08-16 DIAGNOSIS — M9902 Segmental and somatic dysfunction of thoracic region: Secondary | ICD-10-CM | POA: Diagnosis not present

## 2023-08-16 DIAGNOSIS — M9903 Segmental and somatic dysfunction of lumbar region: Secondary | ICD-10-CM | POA: Diagnosis not present

## 2023-08-16 DIAGNOSIS — M9904 Segmental and somatic dysfunction of sacral region: Secondary | ICD-10-CM | POA: Diagnosis not present

## 2023-08-16 DIAGNOSIS — M4727 Other spondylosis with radiculopathy, lumbosacral region: Secondary | ICD-10-CM | POA: Diagnosis not present

## 2023-08-16 DIAGNOSIS — M5432 Sciatica, left side: Secondary | ICD-10-CM | POA: Diagnosis not present

## 2023-09-20 ENCOUNTER — Ambulatory Visit (INDEPENDENT_AMBULATORY_CARE_PROVIDER_SITE_OTHER): Payer: Medicare PPO | Admitting: Obstetrics and Gynecology

## 2023-09-20 ENCOUNTER — Encounter: Payer: Self-pay | Admitting: Obstetrics and Gynecology

## 2023-09-20 VITALS — BP 138/82 | HR 56 | Wt 139.8 lb

## 2023-09-20 DIAGNOSIS — Z01818 Encounter for other preprocedural examination: Secondary | ICD-10-CM

## 2023-09-20 MED ORDER — ACETAMINOPHEN 500 MG PO TABS: 500.0000 mg | ORAL_TABLET | Freq: Four times a day (QID) | ORAL | 0 refills | Status: DC | PRN

## 2023-09-20 MED ORDER — POLYETHYLENE GLYCOL 3350 17 GM/SCOOP PO POWD: 17.0000 g | Freq: Every day | ORAL | 0 refills | Status: DC

## 2023-09-20 MED ORDER — OXYCODONE HCL 5 MG PO TABS
5.0000 mg | ORAL_TABLET | ORAL | 0 refills | Status: DC | PRN
Start: 2023-09-20 — End: 2023-10-08

## 2023-09-20 MED ORDER — IBUPROFEN 600 MG PO TABS: 600.0000 mg | ORAL_TABLET | Freq: Four times a day (QID) | ORAL | 0 refills | Status: DC | PRN

## 2023-09-20 NOTE — Progress Notes (Signed)
[facility] Pre-Operative Exam  Subjective Chief Complaint: Morgan Gibbs presents for a preoperative encounter.   History of Present Illness: Morgan Gibbs is a 77 y.o. female who presents for preoperative visit.  She is scheduled to undergo Exam under anesthesia, total vaginal hysterectomy, bilateral salpingectomy, uterosacral ligament suspension, anterior and posterior repair with perineorrhaphy, and cystoscopy  on 11/18.  Her symptoms include pelvic organ prolapse, and she was was found to have Stage IV anterior, Stage IV posterior, Stage IV apical prolapse.   Urodynamics showed: 1. Sensation was reduced; capacity was increased 2. Stress Incontinence was demonstrated at normal pressures; 3. Detrusor Overactivity was demonstrated without leakage. 4. Emptying was normal with a normal PVR, a sustained detrusor contraction present,  abdominal straining not present, mild dyssynergic urethral sphincter activity at the end of void on EMG.  Past Medical History:  Diagnosis Date   Thyroid condition 2018     Past Surgical History:  Procedure Laterality Date   URETHROPEXY     Raz procedure   VAGINAL PROLAPSE REPAIR     anterior reapir    has No Known Allergies.   Family History  Problem Relation Age of Onset   Dementia Mother    Lung cancer Father    Brain cancer Sister    Colon cancer Sister     Social History   Tobacco Use   Smoking status: Never   Smokeless tobacco: Never  Vaping Use   Vaping status: Never Used  Substance Use Topics   Alcohol use: No   Drug use: No     Review of Systems was negative for a full 10 system review except as noted in the History of Present Illness.  No current outpatient medications on file.   Objective Vitals:   09/20/23 0928  BP: 138/82  Pulse: (!) 56    Gen: NAD CV: S1 S2 RRR Lungs: Clear to auscultation bilaterally Abd: soft, nontender   Previous Pelvic Exam showed: Normal external  genitalia. On speculum, normal vaginal mucosa and normal appearing cervix. On bimanual, uterus is small, mobile and nontender.    POP-Q   3                                            Aa   4                                           Ba   4                                              C    5.5                                            Gh   4.5                                            Pb  6                                            tvl    3                                            Ap   4                                            Bp   -1.5       Assessment/ Plan  Assessment: The patient is a 77 y.o. year old scheduled to undergo  Exam under anesthesia, total vaginal hysterectomy, bilateral salpingectomy, uterosacral ligament suspension, anterior and posterior repair with perineorrhaphy, and cystoscopy. Verbal consent was obtained for these procedures.  Plan: General Surgical Consent: The patient has previously been counseled on alternative treatments, and the decision by the patient and provider was to proceed with the procedure listed above.  For all procedures, there are risks of bleeding, infection, damage to surrounding organs including but not limited to bowel, bladder, blood vessels, ureters and nerves, and need for further surgery if an injury were to occur. These risks are all low with minimally invasive surgery.   There are risks of numbness and weakness at any body site or buttock/rectal pain.  It is possible that baseline pain can be worsened by surgery, either with or without mesh. If surgery is vaginal, there is also a low risk of possible conversion to laparoscopy or open abdominal incision where indicated. Very rare risks include blood transfusion, blood clot, heart attack, pneumonia, or death.   There is also a risk of short-term postoperative urinary retention with need to use a catheter. About half of patients need to go home from surgery with a catheter,  which is then later removed in the office. The risk of long-term need for a catheter is very low. There is also a risk of worsening of overactive bladder.   Prolapse (with or without mesh): Risk factors for surgical failure  include things that put pressure on your pelvis and the surgical repair, including obesity, chronic cough, and heavy lifting or straining (including lifting children or adults, straining on the toilet, or lifting heavy objects such as furniture or anything weighing >25 lbs. Risks of recurrence is 20-30% with vaginal native tissue repair and a less than 10% with sacrocolpopexy with mesh.     We discussed consent for blood products. Risks for blood transfusion include allergic reactions, other reactions that can affect different body organs and managed accordingly, transmission of infectious diseases such as HIV or Hepatitis. However, the blood is screened. Patient consents for blood products.  Pre-operative instructions:  She was instructed to not take Aspirin/NSAIDs x 7days prior to surgery.  Antibiotic prophylaxis was ordered as indicated.  Catheter use: Patient will go home with foley if needed after post-operative voiding trial.  Post-operative instructions:  She was provided with specific post-operative instructions, including precautions and signs/symptoms for which we would recommend contacting us, in addition to daytime and after-hours contact phone numbers. This was provided on a handout.   Post-operative medications: Prescriptions for motrin, tylenol, miralax,  and oxycodone were sent to her pharmacy. Discussed using ibuprofen and tylenol on a schedule to limit use of narcotics.   Laboratory testing:  We will check labs: CBC and Type and screen  Preoperative clearance:  She does not require surgical clearance.    Post-operative follow-up:  A post-operative appointment will be made for 6 weeks from the date of surgery. If she needs a post-operative nurse visit for a  voiding trial, that will be set up after she leaves the hospital.    Patient will call the clinic or use MyChart should anything change or any new issues arise.   Selmer Dominion, NP

## 2023-10-01 DIAGNOSIS — M47816 Spondylosis without myelopathy or radiculopathy, lumbar region: Secondary | ICD-10-CM | POA: Diagnosis not present

## 2023-10-01 DIAGNOSIS — M47894 Other spondylosis, thoracic region: Secondary | ICD-10-CM | POA: Diagnosis not present

## 2023-10-01 DIAGNOSIS — M9902 Segmental and somatic dysfunction of thoracic region: Secondary | ICD-10-CM | POA: Diagnosis not present

## 2023-10-01 DIAGNOSIS — M47812 Spondylosis without myelopathy or radiculopathy, cervical region: Secondary | ICD-10-CM | POA: Diagnosis not present

## 2023-10-01 DIAGNOSIS — M9903 Segmental and somatic dysfunction of lumbar region: Secondary | ICD-10-CM | POA: Diagnosis not present

## 2023-10-01 DIAGNOSIS — M9901 Segmental and somatic dysfunction of cervical region: Secondary | ICD-10-CM | POA: Diagnosis not present

## 2023-10-04 ENCOUNTER — Encounter (HOSPITAL_BASED_OUTPATIENT_CLINIC_OR_DEPARTMENT_OTHER): Payer: Self-pay | Admitting: Obstetrics and Gynecology

## 2023-10-05 NOTE — Progress Notes (Signed)
Your procedure is scheduled on Monday, 10/11/2023.  Report to [facility] AT  5:30 AM.   Call this number if you have problems the morning of surgery  [phone].   OUR ADDRESS IS [address].  WE ARE LOCATED IN [facility].  PLEASE BRING YOUR INSURANCE CARD AND PHOTO ID DAY OF SURGERY.  ONLY 2 PEOPLE ARE ALLOWED IN  WAITING  ROOM                                      REMEMBER:  DO NOT EAT FOOD, CANDY GUM OR MINTS  AFTER MIDNIGHT THE NIGHT BEFORE YOUR SURGERY . YOU MAY HAVE CLEAR LIQUIDS FROM MIDNIGHT THE NIGHT BEFORE YOUR SURGERY UNTIL  4:30 AM. NO CLEAR LIQUIDS AFTER  4:30 AM DAY OF SURGERY.  YOU MAY  BRUSH YOUR TEETH MORNING OF SURGERY AND RINSE YOUR MOUTH OUT, NO CHEWING GUM CANDY OR MINTS.     CLEAR LIQUID DIET    Allowed      Water                                                                   Coffee and tea, regular and decaf  (NO cream or milk products of any type, may sweeten)                         Carbonated beverages, regular and diet                                    Sports drinks like Gatorade _____________________________________________________________________     TAKE ONLY THESE MEDICATIONS MORNING OF SURGERY: Zyrtec if needed                                        DO NOT WEAR JEWERLY/  METAL/  PIERCINGS (INCLUDING NO PLASTIC PIERCINGS) DO NOT WEAR LOTIONS, POWDERS, PERFUMES OR NAIL POLISH ON YOUR FINGERNAILS. TOENAIL POLISH IS OK TO WEAR. DO NOT SHAVE FOR 48 HOURS PRIOR TO DAY OF SURGERY.  CONTACTS, GLASSES, OR DENTURES MAY NOT BE WORN TO SURGERY.  REMEMBER: NO SMOKING, VAPING ,  DRUGS OR ALCOHOL FOR 24 HOURS BEFORE YOUR SURGERY.                                    [place] IS NOT RESPONSIBLE  FOR ANY BELONGINGS.                                                                    Marland Kitchen           [facility] - Preparing for Surgery Before  surgery, you can play an important role.  Because skin is not  sterile, your skin needs to be as free of germs as possible.  You can reduce the number of germs on your skin by washing with CHG (chlorahexidine gluconate) soap before surgery.  CHG is an antiseptic cleaner which kills germs and bonds with the skin to continue killing germs even after washing. Please DO NOT use if you have an allergy to CHG or antibacterial soaps.  If your skin becomes reddened/irritated stop using the CHG and inform your nurse when you arrive at Short Stay. Do not shave (including legs and underarms) for at least 48 hours prior to the first CHG shower.  You may shave your face/neck. Please follow these instructions carefully:  1.  Shower with CHG Soap the night before surgery and the  morning of Surgery.  2.  If you choose to wash your hair, wash your hair first as usual with your  normal  shampoo.  3.  After you shampoo, rinse your hair and body thoroughly to remove the  shampoo.                                        4.  Use CHG as you would any other liquid soap.  You can apply chg directly  to the skin and wash , chg soap provided, night before and morning of your surgery.  5.  Apply the CHG Soap to your body ONLY FROM THE NECK DOWN.   Do not use on face/ open                           Wound or open sores. Avoid contact with eyes, ears mouth and genitals (private parts).                       Wash face,  Genitals (private parts) with your normal soap.             6.  Wash thoroughly, paying special attention to the area where your surgery  will be performed.  7.  Thoroughly rinse your body with warm water from the neck down.  8.  DO NOT shower/wash with your normal soap after using and rinsing off  the CHG Soap.             9.  Pat yourself dry with a clean towel.            10.  Wear clean pajamas.            11.  Place clean sheets on your bed the night of your first shower and do not  sleep with pets. Day of Surgery : Do not apply any lotions/ powders the morning of  surgery.  Please wear clean clothes to the hospital/surgery center.  IF YOU HAVE ANY SKIN IRRITATION OR PROBLEMS WITH THE SURGICAL SOAP, PLEASE GET A BAR OF GOLD DIAL SOAP AND SHOWER THE NIGHT BEFORE YOUR SURGERY AND THE MORNING OF YOUR SURGERY. PLEASE LET THE NURSE KNOW MORNING OF YOUR SURGERY IF YOU HAD ANY PROBLEMS WITH THE SURGICAL SOAP.   YOUR SURGEON MAY HAVE REQUESTED EXTENDED RECOVERY TIME AFTER YOUR SURGERY. IT COULD BE A  JUST A FEW HOURS  UP TO AN OVERNIGHT STAY.  YOUR SURGEON SHOULD HAVE DISCUSSED THIS WITH YOU PRIOR TO YOUR  SURGERY. IN THE EVENT YOU NEED TO STAY OVERNIGHT PLEASE REFER TO THE FOLLOWING GUIDELINES. YOU MAY HAVE UP TO 4 VISITORS  MAY VISIT IN THE EXTENDED RECOVERY ROOM UNTIL 800 PM ONLY.  ONE  VISITOR AGE 32 AND OVER MAY SPEND THE NIGHT AND MUST BE IN EXTENDED RECOVERY ROOM NO LATER THAN 800 PM . YOUR DISCHARGE TIME AFTER YOU SPEND THE NIGHT IS 900 AM THE MORNING AFTER YOUR SURGERY. YOU MAY PACK A SMALL OVERNIGHT BAG WITH TOILETRIES FOR YOUR OVERNIGHT STAY IF YOU WISH.  REGARDLESS OF IF YOU STAY OVER NIGHT OR ARE DISCHARGED THE SAME DAY YOU WILL BE REQUIRED TO HAVE A RESPONSIBLE ADULT (18 YRS OLD OR OLDER) STAY WITH YOU FOR AT LEAST THE FIRST 24 HOURS  YOUR PRESCRIPTION MEDICATIONS WILL BE PROVIDED DURING YOUR HOSPITAL STAY.  ________________________________________________________________________                                                        QUESTIONS Mechele Claude PRE OP NURSE PHONE [phone].

## 2023-10-06 ENCOUNTER — Other Ambulatory Visit: Payer: Self-pay

## 2023-10-06 ENCOUNTER — Encounter (HOSPITAL_BASED_OUTPATIENT_CLINIC_OR_DEPARTMENT_OTHER): Payer: Self-pay | Admitting: Obstetrics and Gynecology

## 2023-10-06 ENCOUNTER — Encounter (HOSPITAL_COMMUNITY)
Admission: RE | Admit: 2023-10-06 | Discharge: 2023-10-06 | Disposition: A | Discharge status: Home / Self Care | Payer: Medicare PPO | Source: Ambulatory Visit | Attending: Obstetrics and Gynecology | Admitting: Obstetrics and Gynecology

## 2023-10-06 DIAGNOSIS — Z01812 Encounter for preprocedural laboratory examination: Secondary | ICD-10-CM | POA: Insufficient documentation

## 2023-10-06 DIAGNOSIS — N814 Uterovaginal prolapse, unspecified: Secondary | ICD-10-CM | POA: Insufficient documentation

## 2023-10-06 DIAGNOSIS — Z01818 Encounter for other preprocedural examination: Secondary | ICD-10-CM

## 2023-10-06 LAB — TYPE AND SCREEN
ABO/RH(D): O POS
Antibody Screen: NEGATIVE

## 2023-10-06 LAB — CBC
HCT: 46.6 % — ABNORMAL HIGH (ref 36.0–46.0)
Hemoglobin: 15.6 g/dL — ABNORMAL HIGH (ref 12.0–15.0)
MCH: 31.3 pg (ref 26.0–34.0)
MCHC: 33.5 g/dL (ref 30.0–36.0)
MCV: 93.4 fL (ref 80.0–100.0)
Platelets: 214 10*3/uL (ref 150–400)
RBC: 4.99 MIL/uL (ref 3.87–5.11)
RDW: 12.6 % (ref 11.5–15.5)
WBC: 6.3 10*3/uL (ref 4.0–10.5)
nRBC: 0 % (ref 0.0–0.2)

## 2023-10-06 NOTE — Progress Notes (Addendum)
Spoke w/ via phone for pre-op interview---Morgan Gibbs needs dos---- none per anesthesia, surgeon orders pending        Gibbs results------10/06/23 labs for cbc, type & screen COVID test -----patient states asymptomatic no test needed Arrive at -------0530 on Monday, 10/11/2023 NPO after MN NO Solid Food.  Clear liquids from MN until---0430 Med rec completed Medications to take morning of surgery -----Zyrtec prn Diabetic medication -----n/a Patient instructed no nail polish to be worn day of surgery Patient instructed to bring photo id and insurance card day of surgery Patient aware to have Driver (ride ) / caregiver    for 24 hours after surgery - husband, Morgan Gibbs and daughters will be caregivers also Patient Special Instructions -----Extended / overnight stay instructions given. Pre-Op special Instructions -----Requested orders from Dr. Florian Buff via Epic IB on 10/05/23. Patient aware that contact lens and hearing aids will need to be removed prior to surgery. Patient verbalized understanding of instructions that were given at this phone interview. Patient denies chest pain, sob, fever, cough at the interview.

## 2023-10-07 ENCOUNTER — Other Ambulatory Visit: Payer: Self-pay

## 2023-10-08 ENCOUNTER — Other Ambulatory Visit: Payer: Self-pay

## 2023-10-08 ENCOUNTER — Other Ambulatory Visit: Payer: Self-pay | Admitting: Obstetrics and Gynecology

## 2023-10-08 DIAGNOSIS — Z01818 Encounter for other preprocedural examination: Secondary | ICD-10-CM

## 2023-10-08 MED ORDER — IBUPROFEN 600 MG PO TABS: 600.0000 mg | ORAL_TABLET | Freq: Four times a day (QID) | ORAL | 0 refills | Status: AC | PRN

## 2023-10-08 MED ORDER — OXYCODONE HCL 5 MG PO TABS: 5.0000 mg | ORAL_TABLET | ORAL | 0 refills | Status: DC | PRN

## 2023-10-08 MED ORDER — POLYETHYLENE GLYCOL 3350 17 GM/SCOOP PO POWD: 17.0000 g | Freq: Every day | ORAL | 0 refills | Status: AC

## 2023-10-08 MED ORDER — ACETAMINOPHEN 500 MG PO TABS: 500.0000 mg | ORAL_TABLET | Freq: Four times a day (QID) | ORAL | 0 refills | Status: AC | PRN

## 2023-10-08 NOTE — H&P (Signed)
[facility] H&P  Subjective Chief Complaint: Morgan Gibbs presents for a preoperative encounter.   History of Present Illness: Morgan Gibbs is a 77 y.o. female who presents for preoperative visit.  She is scheduled to undergo Exam under anesthesia, total vaginal hysterectomy, bilateral salpingectomy, uterosacral ligament suspension, anterior and posterior repair with perineorrhaphy, and cystoscopy  on 11/18.  Her symptoms include pelvic organ prolapse, and she was was found to have Stage IV anterior, Stage IV posterior, Stage IV apical prolapse.   Urodynamics showed: 1. Sensation was reduced; capacity was increased 2. Stress Incontinence was demonstrated at normal pressures; 3. Detrusor Overactivity was demonstrated without leakage. 4. Emptying was normal with a normal PVR, a sustained detrusor contraction present,  abdominal straining not present, mild dyssynergic urethral sphincter activity at the end of void on EMG.  Past Medical History:  Diagnosis Date   Anxiety    resolved   Hammer toe of left foot    Hypothyroidism 2018   takes thyroid supplement   Pneumonia 11/2019   w/ Covid   Wears contact lenses    only in one eye   Wears hearing aid      Past Surgical History:  Procedure Laterality Date   UMBILICAL HERNIA REPAIR  1975   URETHROPEXY  1995   Raz procedure   VAGINAL PROLAPSE REPAIR  1995   anterior repair    is allergic to sulfa antibiotics.   Family History  Problem Relation Age of Onset   Dementia Mother    Lung cancer Father    Brain cancer Sister    Colon cancer Sister     Social History   Tobacco Use   Smoking status: Never   Smokeless tobacco: Never  Vaping Use   Vaping status: Never Used  Substance Use Topics   Alcohol use: No   Drug use: No     Review of Systems was negative for a full 10 system review except as noted in the History of Present Illness.  No current facility-administered medications for  this encounter.  Current Outpatient Medications:    ascorbic acid (VITAMIN C) 500 MG tablet, Take 500 mg by mouth daily., Disp: , Rfl:    calcium-vitamin D (OSCAL WITH D) 500-5 MG-MCG tablet, Take 1 tablet by mouth., Disp: , Rfl:    cetirizine (ZYRTEC) 10 MG chewable tablet, Chew 10 mg by mouth as needed for allergies., Disp: , Rfl:    Digestive Enzymes (DIGESTIVE ENZYME PO), Take by mouth. Digest Gold, Disp: , Rfl:    Meclizine HCl 25 MG CHEW, Chew by mouth. 1/2 tablet , then 1/4 tablet until dizziness is resolved, Disp: , Rfl:    MELATONIN ER PO, Take by mouth. Melatonin gummies at bedtime., Disp: , Rfl:    Multiple Vitamin (MULTIVITAMIN WITH MINERALS) TABS tablet, Take 1 tablet by mouth daily., Disp: , Rfl:    Multiple Vitamins-Minerals (IMMUNE SUPPORT PO), Take by mouth., Disp: , Rfl:    naproxen sodium (ALEVE) 220 MG tablet, Take 220 mg by mouth daily as needed., Disp: , Rfl:    Probiotic Product (PROBIOTIC BLEND PO), Take by mouth., Disp: , Rfl:    THYROID PO, Take by mouth. Thyroid supplement daily., Disp: , Rfl:    Zinc Citrate POWD, by Does not apply route. Zinc powder supplement at bedtime., Disp: , Rfl:    acetaminophen (TYLENOL) 500 MG tablet, Take 1 tablet (500 mg total) by mouth every 6 (six) hours as needed (pain)., Disp: 30 tablet, Rfl:  0   ibuprofen (ADVIL) 600 MG tablet, Take 1 tablet (600 mg total) by mouth every 6 (six) hours as needed., Disp: 30 tablet, Rfl: 0   oxyCODONE (OXY IR/ROXICODONE) 5 MG immediate release tablet, Take 1 tablet (5 mg total) by mouth every 4 (four) hours as needed for severe pain (pain score 7-10)., Disp: 10 tablet, Rfl: 0   polyethylene glycol powder (GLYCOLAX/MIRALAX) 17 GM/SCOOP powder, Take 17 g by mouth daily. Drink 17g (1 scoop) dissolved in water per day., Disp: 255 g, Rfl: 0   Objective There were no vitals filed for this visit.   Gen: NAD CV: S1 S2 RRR Lungs: Clear to auscultation bilaterally Abd: soft, nontender   Previous Pelvic  Exam showed: Normal external genitalia. On speculum, normal vaginal mucosa and normal appearing cervix. On bimanual, uterus is small, mobile and nontender.    POP-Q   3                                            Aa   4                                           Ba   4                                              C    5.5                                            Gh   4.5                                            Pb   6                                            tvl    3                                            Ap   4                                            Bp   -1.5       Assessment/ Plan  Assessment: The patient is a 77 y.o. year old scheduled to undergo  Exam under anesthesia, total vaginal hysterectomy, bilateral salpingectomy, uterosacral ligament suspension, anterior and posterior repair with perineorrhaphy, and cystoscopy. Verbal consent was obtained for these procedures.

## 2023-10-08 NOTE — Anesthesia Preprocedure Evaluation (Signed)
Anesthesia Evaluation  Patient identified by MRN, date of birth, ID band Patient awake    Reviewed: Allergy & Precautions, NPO status , Patient's Chart, lab work & pertinent test results  Airway Mallampati: II  TM Distance: >3 FB Neck ROM: Full    Dental no notable dental hx.    Pulmonary neg pulmonary ROS   Pulmonary exam normal        Cardiovascular negative cardio ROS  Rhythm:Regular Rate:Normal     Neuro/Psych   Anxiety     negative neurological ROS     GI/Hepatic negative GI ROS, Neg liver ROS,,,  Endo/Other  Hypothyroidism    Renal/GU negative Renal ROS  Female GU complaint     Musculoskeletal negative musculoskeletal ROS (+)    Abdominal Normal abdominal exam  (+)   Peds  Hematology Lab Results      Component                Value               Date                      WBC                      6.3                 10/06/2023                HGB                      15.6 (H)            10/06/2023                HCT                      46.6 (H)            10/06/2023                MCV                      93.4                10/06/2023                PLT                      214                 10/06/2023              Anesthesia Other Findings   Reproductive/Obstetrics                             Anesthesia Physical Anesthesia Plan  ASA: 2  Anesthesia Plan: General   Post-op Pain Management: Tylenol PO (pre-op)* and Gabapentin PO (pre-op)*   Induction: Intravenous  PONV Risk Score and Plan: 3 and Ondansetron, Dexamethasone and Treatment may vary due to age or medical condition  Airway Management Planned: Mask and Oral ETT  Additional Equipment: None  Intra-op Plan:   Post-operative Plan: Extubation in OR  Informed Consent: I have reviewed the patients History and Physical, chart, labs and discussed the procedure including the risks, benefits and alternatives for the  proposed anesthesia with the patient  or authorized representative who has indicated his/her understanding and acceptance.     Dental advisory given  Plan Discussed with: CRNA  Anesthesia Plan Comments:        Anesthesia Quick Evaluation

## 2023-10-11 ENCOUNTER — Other Ambulatory Visit: Payer: Self-pay

## 2023-10-11 ENCOUNTER — Ambulatory Visit (HOSPITAL_BASED_OUTPATIENT_CLINIC_OR_DEPARTMENT_OTHER)
Admission: RE | Admit: 2023-10-11 | Discharge: 2023-10-11 | Disposition: A | Discharge status: Home / Self Care | Payer: Medicare PPO | Attending: Obstetrics and Gynecology | Admitting: Obstetrics and Gynecology

## 2023-10-11 ENCOUNTER — Encounter (HOSPITAL_BASED_OUTPATIENT_CLINIC_OR_DEPARTMENT_OTHER): Payer: Self-pay | Admitting: Obstetrics and Gynecology

## 2023-10-11 ENCOUNTER — Ambulatory Visit (HOSPITAL_BASED_OUTPATIENT_CLINIC_OR_DEPARTMENT_OTHER): Payer: Self-pay | Admitting: Anesthesiology

## 2023-10-11 ENCOUNTER — Encounter (HOSPITAL_BASED_OUTPATIENT_CLINIC_OR_DEPARTMENT_OTHER)
Admission: RE | Disposition: A | Discharge status: Home / Self Care | Payer: Self-pay | Source: Home / Self Care | Attending: Obstetrics and Gynecology

## 2023-10-11 ENCOUNTER — Ambulatory Visit (HOSPITAL_BASED_OUTPATIENT_CLINIC_OR_DEPARTMENT_OTHER): Payer: Medicare PPO | Admitting: Anesthesiology

## 2023-10-11 DIAGNOSIS — N393 Stress incontinence (female) (male): Secondary | ICD-10-CM | POA: Diagnosis not present

## 2023-10-11 DIAGNOSIS — N814 Uterovaginal prolapse, unspecified: Secondary | ICD-10-CM

## 2023-10-11 DIAGNOSIS — R234 Changes in skin texture: Secondary | ICD-10-CM | POA: Diagnosis not present

## 2023-10-11 DIAGNOSIS — I70209 Unspecified atherosclerosis of native arteries of extremities, unspecified extremity: Secondary | ICD-10-CM | POA: Diagnosis not present

## 2023-10-11 DIAGNOSIS — N812 Incomplete uterovaginal prolapse: Secondary | ICD-10-CM | POA: Insufficient documentation

## 2023-10-11 DIAGNOSIS — N8003 Adenomyosis of the uterus: Secondary | ICD-10-CM | POA: Diagnosis not present

## 2023-10-11 DIAGNOSIS — N816 Rectocele: Secondary | ICD-10-CM | POA: Diagnosis not present

## 2023-10-11 DIAGNOSIS — N858 Other specified noninflammatory disorders of uterus: Secondary | ICD-10-CM | POA: Diagnosis not present

## 2023-10-11 DIAGNOSIS — E039 Hypothyroidism, unspecified: Secondary | ICD-10-CM | POA: Diagnosis not present

## 2023-10-11 DIAGNOSIS — Z01818 Encounter for other preprocedural examination: Secondary | ICD-10-CM

## 2023-10-11 DIAGNOSIS — N8000 Endometriosis of the uterus, unspecified: Secondary | ICD-10-CM | POA: Diagnosis not present

## 2023-10-11 DIAGNOSIS — L859 Epidermal thickening, unspecified: Secondary | ICD-10-CM | POA: Diagnosis not present

## 2023-10-11 HISTORY — PX: ANTERIOR AND POSTERIOR REPAIR: SHX5121

## 2023-10-11 HISTORY — PX: VAGINAL PROLAPSE REPAIR: SHX830

## 2023-10-11 HISTORY — DX: Other hammer toe(s) (acquired), left foot: M20.42

## 2023-10-11 HISTORY — DX: Anxiety disorder, unspecified: F41.9

## 2023-10-11 HISTORY — PX: VAGINAL HYSTERECTOMY: SHX2639

## 2023-10-11 HISTORY — PX: CYSTOSCOPY: SHX5120

## 2023-10-11 HISTORY — DX: Presence of spectacles and contact lenses: Z97.3

## 2023-10-11 HISTORY — DX: Presence of external hearing-aid: Z97.4

## 2023-10-11 LAB — TYPE AND SCREEN
ABO/RH(D): O POS
Antibody Screen: NEGATIVE

## 2023-10-11 SURGERY — HYSTERECTOMY, VAGINAL
Anesthesia: General | Site: Vagina

## 2023-10-11 MED ORDER — DEXAMETHASONE SODIUM PHOSPHATE 10 MG/ML IJ SOLN
INTRAMUSCULAR | Status: AC
  Filled 2023-10-11: qty 1

## 2023-10-11 MED ORDER — SIMETHICONE 80 MG PO CHEW: 80.0000 mg | CHEWABLE_TABLET | Freq: Four times a day (QID) | ORAL | Status: DC | PRN

## 2023-10-11 MED ORDER — FENTANYL CITRATE (PF) 100 MCG/2ML IJ SOLN
INTRAMUSCULAR | Status: AC
  Filled 2023-10-11: qty 2

## 2023-10-11 MED ORDER — PROPOFOL 10 MG/ML IV BOLUS
INTRAVENOUS | Status: AC
  Filled 2023-10-11: qty 20

## 2023-10-11 MED ORDER — FENTANYL CITRATE (PF) 100 MCG/2ML IJ SOLN
25.0000 ug | INTRAMUSCULAR | Status: DC | PRN
  Administered 2023-10-11: 25 ug via INTRAVENOUS

## 2023-10-11 MED ORDER — PHENAZOPYRIDINE HCL 100 MG PO TABS
200.0000 mg | ORAL_TABLET | ORAL | Status: AC
Start: 2023-10-11 — End: 2023-10-11
  Administered 2023-10-11: 200 mg via ORAL

## 2023-10-11 MED ORDER — ACETAMINOPHEN 325 MG PO TABS
ORAL_TABLET | ORAL | Status: AC
  Filled 2023-10-11: qty 2

## 2023-10-11 MED ORDER — WHITE PETROLATUM EX OINT
TOPICAL_OINTMENT | CUTANEOUS | Status: AC
  Filled 2023-10-11: qty 5

## 2023-10-11 MED ORDER — ACETAMINOPHEN 500 MG PO TABS
ORAL_TABLET | ORAL | Status: AC
  Filled 2023-10-11: qty 2

## 2023-10-11 MED ORDER — CEFAZOLIN SODIUM-DEXTROSE 2-4 GM/100ML-% IV SOLN
2.0000 g | INTRAVENOUS | Status: AC
  Administered 2023-10-11: 2 g via INTRAVENOUS

## 2023-10-11 MED ORDER — GABAPENTIN 300 MG PO CAPS
ORAL_CAPSULE | ORAL | Status: AC
  Filled 2023-10-11: qty 1

## 2023-10-11 MED ORDER — FENTANYL CITRATE (PF) 100 MCG/2ML IJ SOLN
INTRAMUSCULAR | Status: DC | PRN
  Administered 2023-10-11: 100 ug via INTRAVENOUS

## 2023-10-11 MED ORDER — ACETAMINOPHEN 325 MG PO TABS
650.0000 mg | ORAL_TABLET | ORAL | Status: DC | PRN
  Administered 2023-10-11 (×2): 650 mg via ORAL

## 2023-10-11 MED ORDER — ROCURONIUM BROMIDE 10 MG/ML (PF) SYRINGE
PREFILLED_SYRINGE | INTRAVENOUS | Status: DC | PRN
  Administered 2023-10-11: 50 mg via INTRAVENOUS
  Administered 2023-10-11: 10 mg via INTRAVENOUS

## 2023-10-11 MED ORDER — PROPOFOL 10 MG/ML IV BOLUS
INTRAVENOUS | Status: DC | PRN
  Administered 2023-10-11: 140 mg via INTRAVENOUS

## 2023-10-11 MED ORDER — OXYCODONE HCL 5 MG PO TABS
5.0000 mg | ORAL_TABLET | ORAL | Status: DC | PRN
  Administered 2023-10-11: 5 mg via ORAL

## 2023-10-11 MED ORDER — SODIUM CHLORIDE 0.9 % IR SOLN
Status: DC | PRN
  Administered 2023-10-11: 500 mL

## 2023-10-11 MED ORDER — KETOROLAC TROMETHAMINE 30 MG/ML IJ SOLN
INTRAMUSCULAR | Status: AC
  Filled 2023-10-11: qty 1

## 2023-10-11 MED ORDER — ROCURONIUM BROMIDE 10 MG/ML (PF) SYRINGE
PREFILLED_SYRINGE | INTRAVENOUS | Status: AC
  Filled 2023-10-11: qty 10

## 2023-10-11 MED ORDER — LIDOCAINE-EPINEPHRINE 1 %-1:100000 IJ SOLN
INTRAMUSCULAR | Status: DC | PRN
  Administered 2023-10-11: 24 mL

## 2023-10-11 MED ORDER — CYCLOBENZAPRINE HCL 5 MG PO TABS
5.0000 mg | ORAL_TABLET | Freq: Three times a day (TID) | ORAL | Status: DC | PRN
  Administered 2023-10-11: 5 mg via ORAL
  Filled 2023-10-11 (×2): qty 1

## 2023-10-11 MED ORDER — IBUPROFEN 200 MG PO TABS
ORAL_TABLET | ORAL | Status: AC
  Filled 2023-10-11: qty 3

## 2023-10-11 MED ORDER — AMISULPRIDE (ANTIEMETIC) 5 MG/2ML IV SOLN: 10.0000 mg | Freq: Once | INTRAVENOUS | Status: DC | PRN

## 2023-10-11 MED ORDER — 0.9 % SODIUM CHLORIDE (POUR BTL) OPTIME
TOPICAL | Status: DC | PRN
  Administered 2023-10-11: 500 mL

## 2023-10-11 MED ORDER — ONDANSETRON HCL 4 MG/2ML IJ SOLN: 4.0000 mg | Freq: Four times a day (QID) | INTRAMUSCULAR | Status: DC | PRN

## 2023-10-11 MED ORDER — DEXMEDETOMIDINE HCL IN NACL 80 MCG/20ML IV SOLN
INTRAVENOUS | Status: DC | PRN
  Administered 2023-10-11 (×2): 8 ug via INTRAVENOUS

## 2023-10-11 MED ORDER — CEFAZOLIN SODIUM-DEXTROSE 2-4 GM/100ML-% IV SOLN
INTRAVENOUS | Status: AC
  Filled 2023-10-11: qty 100

## 2023-10-11 MED ORDER — LIDOCAINE HCL (PF) 2 % IJ SOLN
INTRAMUSCULAR | Status: AC
  Filled 2023-10-11: qty 5

## 2023-10-11 MED ORDER — IBUPROFEN 200 MG PO TABS
600.0000 mg | ORAL_TABLET | Freq: Four times a day (QID) | ORAL | Status: DC
  Administered 2023-10-11: 600 mg via ORAL

## 2023-10-11 MED ORDER — EPHEDRINE SULFATE-NACL 50-0.9 MG/10ML-% IV SOSY
PREFILLED_SYRINGE | INTRAVENOUS | Status: DC | PRN
  Administered 2023-10-11 (×4): 5 mg via INTRAVENOUS

## 2023-10-11 MED ORDER — ONDANSETRON HCL 4 MG PO TABS: 4.0000 mg | ORAL_TABLET | Freq: Four times a day (QID) | ORAL | Status: DC | PRN

## 2023-10-11 MED ORDER — DEXAMETHASONE SODIUM PHOSPHATE 10 MG/ML IJ SOLN
INTRAMUSCULAR | Status: DC | PRN
  Administered 2023-10-11: 10 mg via INTRAVENOUS

## 2023-10-11 MED ORDER — OXYCODONE HCL 5 MG/5ML PO SOLN: 5.0000 mg | Freq: Once | ORAL | Status: DC | PRN

## 2023-10-11 MED ORDER — ENOXAPARIN SODIUM 40 MG/0.4ML IJ SOSY: 40.0000 mg | PREFILLED_SYRINGE | INTRAMUSCULAR | Status: DC

## 2023-10-11 MED ORDER — ACETAMINOPHEN 500 MG PO TABS
1000.0000 mg | ORAL_TABLET | ORAL | Status: AC
  Administered 2023-10-11: 1000 mg via ORAL

## 2023-10-11 MED ORDER — OXYCODONE HCL 5 MG PO TABS
ORAL_TABLET | ORAL | Status: AC
  Filled 2023-10-11: qty 1

## 2023-10-11 MED ORDER — OXYCODONE HCL 5 MG PO TABS: 5.0000 mg | ORAL_TABLET | Freq: Once | ORAL | Status: DC | PRN

## 2023-10-11 MED ORDER — CYCLOBENZAPRINE HCL 5 MG PO TABS: 5.0000 mg | ORAL_TABLET | Freq: Three times a day (TID) | ORAL | 0 refills | Status: AC | PRN

## 2023-10-11 MED ORDER — EPHEDRINE 5 MG/ML INJ
INTRAVENOUS | Status: AC
  Filled 2023-10-11: qty 5

## 2023-10-11 MED ORDER — GABAPENTIN 300 MG PO CAPS
300.0000 mg | ORAL_CAPSULE | ORAL | Status: AC
  Administered 2023-10-11: 300 mg via ORAL

## 2023-10-11 MED ORDER — ENOXAPARIN SODIUM 40 MG/0.4ML IJ SOSY
PREFILLED_SYRINGE | INTRAMUSCULAR | Status: AC
  Filled 2023-10-11: qty 0.4

## 2023-10-11 MED ORDER — ONDANSETRON HCL 4 MG/2ML IJ SOLN
INTRAMUSCULAR | Status: DC | PRN
  Administered 2023-10-11: 4 mg via INTRAVENOUS

## 2023-10-11 MED ORDER — ONDANSETRON HCL 4 MG/2ML IJ SOLN
INTRAMUSCULAR | Status: AC
  Filled 2023-10-11: qty 2

## 2023-10-11 MED ORDER — LACTATED RINGERS IV SOLN
INTRAVENOUS | Status: DC
Start: 2023-10-11 — End: 2023-10-12

## 2023-10-11 MED ORDER — DEXMEDETOMIDINE HCL IN NACL 80 MCG/20ML IV SOLN
INTRAVENOUS | Status: AC
  Filled 2023-10-11: qty 20

## 2023-10-11 MED ORDER — LIDOCAINE 2% (20 MG/ML) 5 ML SYRINGE
INTRAMUSCULAR | Status: DC | PRN
  Administered 2023-10-11: 60 mg via INTRAVENOUS

## 2023-10-11 MED ORDER — PHENAZOPYRIDINE HCL 100 MG PO TABS
ORAL_TABLET | ORAL | Status: AC
  Filled 2023-10-11: qty 2

## 2023-10-11 MED ORDER — KETOROLAC TROMETHAMINE 15 MG/ML IJ SOLN
INTRAMUSCULAR | Status: DC | PRN
  Administered 2023-10-11: 15 mg via INTRAVENOUS

## 2023-10-11 MED ORDER — SUGAMMADEX SODIUM 200 MG/2ML IV SOLN
INTRAVENOUS | Status: DC | PRN
  Administered 2023-10-11: 130 mg via INTRAVENOUS

## 2023-10-11 MED ORDER — STERILE WATER FOR IRRIGATION IR SOLN
Status: DC | PRN
Start: 2023-10-11 — End: 2023-10-11
  Administered 2023-10-11: 1000 mL

## 2023-10-11 SURGICAL SUPPLY — 46 items
BLADE CLIPPER SENSICLIP SURGIC (BLADE) IMPLANT
BLADE SURG 15 STRL LF DISP TIS (BLADE) ×2 IMPLANT
BLADE SURG 15 STRL SS (BLADE) ×2
COVER MAYO STAND STRL (DRAPES) IMPLANT
DEVICE CAPIO SLIM SINGLE (INSTRUMENTS) IMPLANT
GAUZE 4X4 16PLY ~~LOC~~+RFID DBL (SPONGE) ×2 IMPLANT
GLOVE BIOGEL PI IND STRL 6.5 (GLOVE) ×2 IMPLANT
GLOVE BIOGEL PI IND STRL 7.0 (GLOVE) ×2 IMPLANT
GLOVE BIOGEL PI IND STRL 8 (GLOVE) IMPLANT
GLOVE ECLIPSE 6.0 STRL STRAW (GLOVE) ×2 IMPLANT
GLOVE SURG SS PI 7.5 STRL IVOR (GLOVE) IMPLANT
GOWN STRL REUS W/TWL LRG LVL3 (GOWN DISPOSABLE) ×2 IMPLANT
HOLDER FOLEY CATH W/STRAP (MISCELLANEOUS) ×2 IMPLANT
IV NS 500ML (IV SOLUTION) ×2
IV NS 500ML BAXH (IV SOLUTION) IMPLANT
KIT TURNOVER CYSTO (KITS) ×2 IMPLANT
LIGASURE IMPACT 36 18CM CVD LR (INSTRUMENTS) ×2 IMPLANT
NDL HYPO 22X1.5 SAFETY MO (MISCELLANEOUS) ×2 IMPLANT
NDL MAYO 6 CRC TAPER PT (NEEDLE) IMPLANT
NEEDLE HYPO 22X1.5 SAFETY MO (MISCELLANEOUS) ×2
NEEDLE MAYO 6 CRC TAPER PT (NEEDLE) ×2
NS IRRIG 1000ML POUR BTL (IV SOLUTION) ×2 IMPLANT
NS IRRIG 500ML POUR BTL (IV SOLUTION) IMPLANT
PACK VAGINAL WOMENS (CUSTOM PROCEDURE TRAY) ×2 IMPLANT
PAD OB MATERNITY 4.3X12.25 (PERSONAL CARE ITEMS) ×2 IMPLANT
RETRACTOR LONE STAR DISPOSABLE (INSTRUMENTS) ×2 IMPLANT
RETRACTOR STAY HOOK 5MM (MISCELLANEOUS) ×2 IMPLANT
SCRUB CHG 4% DYNA-HEX 4OZ (MISCELLANEOUS) ×2 IMPLANT
SET IRRIG Y TYPE TUR BLADDER L (SET/KITS/TRAYS/PACK) ×2 IMPLANT
SLEEVE SCD COMPRESS KNEE MED (STOCKING) ×2 IMPLANT
SPIKE FLUID TRANSFER (MISCELLANEOUS) IMPLANT
SPONGE T-LAP 18X36 ~~LOC~~+RFID STR (SPONGE) ×2 IMPLANT
SURGIFLO W/THROMBIN 8M KIT (HEMOSTASIS) IMPLANT
SUT ABS MONO DBL WITH NDL 48IN (SUTURE) IMPLANT
SUT PDS PLUS AB 0 CT-2 (SUTURE) ×8 IMPLANT
SUT VIC AB 0 CT1 27XBRD ANBCTR (SUTURE) IMPLANT
SUT VIC AB 0 CT1 27XCR 8 STRN (SUTURE) ×4 IMPLANT
SUT VIC AB 2-0 SH 27XBRD (SUTURE) IMPLANT
SUT VIC AB CT1 27XBRD ANBCTRL (SUTURE) ×2
SUT VICRYL 2-0 SH 8X27 (SUTURE) ×2 IMPLANT
SYR 10ML LL (SYRINGE) IMPLANT
SYR BULB EAR ULCER 3OZ GRN STR (SYRINGE) ×2 IMPLANT
TOWEL OR 17X24 6PK STRL BLUE (TOWEL DISPOSABLE) ×2 IMPLANT
TRAY FOLEY W/BAG SLVR 14FR LF (SET/KITS/TRAYS/PACK) ×2 IMPLANT
UNDERPAD 30X36 HEAVY ABSORB (UNDERPADS AND DIAPERS) ×2 IMPLANT
WATER STERILE IRR 1000ML POUR (IV SOLUTION) IMPLANT

## 2023-10-11 NOTE — Op Note (Signed)
Operative Note  Preoperative Diagnosis: anterior vaginal prolapse, posterior vaginal prolapse, and uterovaginal prolapse, incomplete  Postoperative Diagnosis: same  Procedures performed:  Total vaginal hysterectomy, uterosacral ligament suspension, anterior and posterior repair, perineorrhaphy, cystoscopy  Implants: none  Attending Surgeon: Lanetta Inch, MD  Anesthesia: General endotracheal  Findings: 1. On vaginal exam, stage III prolapse noted  2. On cystoscopy, normal bladder and urethra without injury or lesion. Brisk bilateral ureteral efflux noted.     Specimens:  ID Type Source Tests Collected by Time Destination  1 : Cervix, uterus Tissue PATH Gyn benign resection SURGICAL PATHOLOGY Marguerita Beards, MD 10/11/2023 [phone]     Estimated blood loss: 50 mL  IV fluids: 750 mL  Urine output: 225 mL  Complications: none  Procedure in Detail:  After informed consent was obtained, the patient was taken to the operating room where she was placed under anesthesia.  She was then placed in the dorsal lithotomy position with Allen stirrups and prepped and draped in the usual sterile fashion.  Care was taken to avoid hyperflexion or hyperextension of her lower extremities.    A self-retaining retractor was placed, and a foley catheter was placed. The cervix was grasped with two tenacula.  The cervix was injected circumferentially with 1% lidocaine with epinephrine. A 10 blade was used to incise circumferentially around the cervix. Anteriorly, the bladder was dissected off the pubocervical fascia using Metzenbaum scissors. A Deaver retractor was placed anteriorly to protect the bladder.  The posterior vagina was grasped with a Kocher clamp. The Mayo scissors were used to enter the posterior cul-de-sac. Palpation confirmed peritoneal entry and no adhesions. The posterior peritoneum was affixed to the vaginal cuff with 0-Vicryl (this suture was used throughout unless otherwise  specified) at the midline. A right angle retractor was placed through the posterior colpotomy. The anterior peritoneal reflection was then identified, tented up and incised with Metzenbaum scissors to create an anterior colpotomy. Palpation confirmed peritoneal entry and no adhesions. The Deaver was placed anteriorly to protect the bladder.A Heaney clamp was then used to clamp the uterosacral ligaments on each side. These were cut and suture ligated using 0-Vicryl in a Heaney fashion, and tagged with hemostats.  The cardinal ligaments were clamped, ligated and cut with the ligasure in a similar fashion on each side. The uterine arteries were also clamped and ligated with the ligasure. The cornua were clamped, cut, free-tied and suture ligated. The uterus and cervix were handed off the field.  Inspection of the pedicles revealed excellent hemostasis.  The patient desired to keep her ovaries. The ovaries and fallopian tubes appeared normal so were left in place.  For the uterosacral ligament suspension (USLS), the bowel was packed away with a moistened lap pad. The posterior cuff edge was grasped with an Allis clamp.  The right and left uterosacral ligaments were identified visually and digitally. Two stitches of 0 PDS was placed through each uterosacral ligament towards its insertion site at the sacrum. These were tagged.. The packing was removed.  A 70-degree cystoscope was introduced, and 360-degree inspection revealed no trauma in the bladder, with bilateral ureteral efflux with tension on the uterosacral sutures.  The bladder was drained and the cystoscope was removed.  The Foley catheter was reinserted.    For the anterior repair, two Allis clamps were placed along the midline of the anterior vaginal wall.  1% lidocaine with epinephrine was injected into the vaginal mucosa.  A 15 blade scalpel was used to incise the vaginal  mucosa in the midline. Allis clamps were placed along this incision and Metzenbaum  scissors were used to sharply dissect the epithelium off of the vesicovaginal septum bilaterally to the level of the pubic rami. Anterior plication of the vesicovaginal septum was then performed using 2-0 Vicryl. The vaginal mucosal edges were reapproximated with 2-0 Vicryl in a running fashion. Hemostasis was noted. The uterosacral stitches were then attached to the posterior and anterior edges of the vaginal cuff on the ipsilateral sides, in a through and through fashion, using a free needle. Figure of eight sutures of 0-Vicryl were placed through the vaginal cuff and tied down. The lateral uterosacral stitches were then tied down with good apical support noted. The Foley catheter was removed. A 70-degree cystoscope was introduced, and 360-degree inspection revealed no trauma in the bladder, with bilateral ureteral efflux. The cystoscope was removed. The medial uterosacral sutures were then tied down. Cystoscopy was repeated and brisk bilateral ureteral efflux was noted. The bladder was drained and the cystoscope was removed.  The Foley catheter was reinserted.  Attention was then turned to the posterior vagina.  Two Allis clamps were in the midline of the posterior vaginal wall defect and two allis clamps at the introitus.  1% lidocaine with epinephrine was injected into the vaginal mucosa. A vertical incision was made between these clamps with a 15 blade scalpel and a diamond shaped area of epithelium was cut at the perineum.  The rectovaginal septum was then dissected off the vaginal mucosa bilaterally. The rectovaginal septum was then plicated with vertical mattress sutures of 2-0 Vicryl.  After placement of the first plication stitch two fingers were inserted into the vaginal to confirm adequate caliber.  The last distal stitch incorporated the perineal body in a U stitch fashion.  After plication, the excess vaginal mucosa was trimmed and the vaginal mucosa was reapproximated using 2-0 Vicryl sutures. The  perineal body was then reapproximated with two interrupted 0-vicryl sutures. The perineal skin was then closed with a 2-0 vicryl in a subcutaneous and running fashion. The vagina was copiously irrigated.  Hemostasis was noted. All instruments were removed. A rectal examination was normal and confirmed no sutures within the rectum.  The patient tolerated the procedure well.  She was awakened from anesthesia and transferred to the recovery room in stable condition. All counts were correct x 2.     Marguerita Beards, MD

## 2023-10-11 NOTE — Anesthesia Procedure Notes (Signed)
Procedure Name: Intubation Date/Time: 10/11/2023 7:42 AM  Performed by: Leona Pressly D, CRNAPre-anesthesia Checklist: Patient identified, Emergency Drugs available, Suction available and Patient being monitored Patient Re-evaluated:Patient Re-evaluated prior to induction Oxygen Delivery Method: Circle system utilized Preoxygenation: Pre-oxygenation with 100% oxygen Induction Type: IV induction Ventilation: Mask ventilation without difficulty Laryngoscope Size: Mac and 3 Grade View: Grade I Tube type: Oral Tube size: 7.0 mm Number of attempts: 1 Airway Equipment and Method: Stylet and Oral airway Placement Confirmation: ETT inserted through vocal cords under direct vision, positive ETCO2 and breath sounds checked- equal and bilateral Secured at: 20 cm Tube secured with: Tape Dental Injury: Teeth and Oropharynx as per pre-operative assessment

## 2023-10-11 NOTE — Interval H&P Note (Signed)
History and Physical Interval Note:  10/11/2023 7:20 AM  Hilbert Corrigan  has presented today for surgery, with the diagnosis of anterior vaginal prolapse; uterovaginal prolapse incomplete; posterior vaginal prolapse;Marland Kitchen  The various methods of treatment have been discussed with the patient and family. After consideration of risks, benefits and other options for treatment, the patient has consented to  Procedure(s) with comments: HYSTERECTOMY VAGINAL WITH BILATERAL SALPINGECTOMY (N/A) VAGINAL VAULT SUSPENSION (N/A) ANTERIOR (CYSTOCELE) AND POSTERIOR REPAIR (RECTOCELE) WITH PERINEORRHAPHY (N/A) CYSTOSCOPY (N/A) as a surgical intervention.  The patient's history has been reviewed, patient examined, no change in status, stable for surgery.  I have reviewed the patient's chart and labs.  Questions were answered to the patient's satisfaction.     Marguerita Beards

## 2023-10-11 NOTE — Transfer of Care (Signed)
Immediate Anesthesia Transfer of Care Note  Patient: Morgan Gibbs  Procedure(s) Performed: HYSTERECTOMY VAGINAL (Vagina ) VAGINAL VAULT SUSPENSION (Vagina ) ANTERIOR (CYSTOCELE) AND POSTERIOR REPAIR (RECTOCELE) WITH PERINEORRHAPHY (Vagina ) CYSTOSCOPY (Bladder)  Patient Location: PACU  Anesthesia Type:General  Level of Consciousness: sedated  Airway & Oxygen Therapy: Patient Spontanous Breathing and Patient connected to nasal cannula oxygen  Post-op Assessment: Report given to RN  Post vital signs: Reviewed and stable  Last Vitals:  Vitals Value Taken Time  BP 122/76 10/11/23 1012  Temp    Pulse 73 10/11/23 1014  Resp 16 10/11/23 1014  SpO2 95 % 10/11/23 1014  Vitals shown include unfiled device data.  Last Pain:  Vitals:   10/11/23 0610  TempSrc: Oral      Patients Stated Pain Goal: 7 (10/11/23 0610)  Complications: No notable events documented.

## 2023-10-11 NOTE — Anesthesia Postprocedure Evaluation (Signed)
Anesthesia Post Note  Patient: GENNETTE BARSKY  Procedure(s) Performed: HYSTERECTOMY VAGINAL (Vagina ) VAGINAL VAULT SUSPENSION (Vagina ) ANTERIOR (CYSTOCELE) AND POSTERIOR REPAIR (RECTOCELE) WITH PERINEORRHAPHY (Vagina ) CYSTOSCOPY (Bladder)     Patient location during evaluation: PACU Anesthesia Type: General Level of consciousness: awake and alert Pain management: pain level controlled Vital Signs Assessment: post-procedure vital signs reviewed and stable Respiratory status: spontaneous breathing, nonlabored ventilation, respiratory function stable and patient connected to nasal cannula oxygen Cardiovascular status: blood pressure returned to baseline and stable Postop Assessment: no apparent nausea or vomiting Anesthetic complications: no   No notable events documented.  Last Vitals:  Vitals:   10/11/23 1113 10/11/23 1140  BP:  132/80  Pulse: 75 76  Resp: 19 17  Temp:  37.2 C  SpO2: 97% 95%    Last Pain:  Vitals:   10/11/23 0610  TempSrc: Oral                 Earl Lites P Catherine Cubero

## 2023-10-11 NOTE — Discharge Instructions (Signed)

## 2023-10-12 ENCOUNTER — Telehealth: Payer: Self-pay | Admitting: Obstetrics and Gynecology

## 2023-10-12 LAB — SURGICAL PATHOLOGY

## 2023-10-12 NOTE — Telephone Encounter (Signed)
Morgan Gibbs underwent Total vaginal hysterectomy, uterosacral suspension, anterior and posterior repair on 10/11/23.   She passed her voiding trial.  was backfilled into the bladder Voided  PVR by bladder scan was 0ml.   She was discharged without a catheter. Please call her for a routine post op check. Thanks!  Marguerita Beards, MD

## 2023-10-13 ENCOUNTER — Encounter (HOSPITAL_BASED_OUTPATIENT_CLINIC_OR_DEPARTMENT_OTHER): Payer: Self-pay | Admitting: Obstetrics and Gynecology

## 2023-10-13 NOTE — Telephone Encounter (Signed)
Post- Op Call  Morgan Gibbs underwent HYSTERECTOMY VAGINAL (Vagina )  VAGINAL VAULT SUSPENSION (Vagina )  ANTERIOR (CYSTOCELE) AND POSTERIOR REPAIR (RECTOCELE) WITH PERINEORRHAPHY (Vagina )  CYSTOSCOPY (Bladder) on 10/11/23 with Dr Florian Buff. The patient reports that her pain is controlled. She is taking  Tylenol and Ibuprofen and 1/2 a muscle relaxer. She Miralax and  Magnesium vaginal bleeding. She has not had a bowel movement  for a bowel regimen. She was discharged without a catheter and will return on 11/23/23 for a voiding trial.   Salina April, CMA

## 2023-11-23 ENCOUNTER — Ambulatory Visit (INDEPENDENT_AMBULATORY_CARE_PROVIDER_SITE_OTHER): Payer: Medicare PPO | Admitting: Obstetrics and Gynecology

## 2023-11-23 ENCOUNTER — Encounter: Payer: Self-pay | Admitting: Obstetrics and Gynecology

## 2023-11-23 VITALS — BP 145/89 | HR 64

## 2023-11-23 DIAGNOSIS — Z9889 Other specified postprocedural states: Secondary | ICD-10-CM

## 2023-11-23 DIAGNOSIS — Z48816 Encounter for surgical aftercare following surgery on the genitourinary system: Secondary | ICD-10-CM

## 2023-11-23 NOTE — Progress Notes (Signed)
 [facility]  Date of Visit: 11/23/2023  History of Present Illness: Morgan Gibbs is a 77 y.o. female scheduled today for a post-operative visit.   Surgery: s/p Total vaginal hysterectomy, uterosacral ligament suspension, anterior and posterior repair, perineorrhaphy, cystoscopy on 10/11/23  She passed her postoperative void trial.   Postoperative course has been uncomplicated.   Today she reports she is feeling well overall. Had some irritation at the opening of the vagina.   UTI in the last 6 weeks? No  Pain? No  She has returned to her normal activity (except for postop restrictions) Vaginal bulge? No  Stress incontinence: No  Urgency/frequency:  did have some last week after not hydrating well. Took azo and feels better.  Urge incontinence:  maybe a small amount- a drop Voiding dysfunction: No  Bowel issues:  no, has been taking magnesium and probiotic  Subjective Success: Do you usually have a bulge or something falling out that you can see or feel in the vaginal area? No  Retreatment Success: Any retreatment with surgery or pessary for any compartment? No   Pathology results: UTERUS, CERVIX, RESECTION:  -  Overall unremarkable cervix with focal hyperkeratosis/parakeratosis  consistent with the clinical history of prolapse, negative for  dysplasia.  - Atrophic endometrium with areas of tubal metaplasia and the presence  of adenomyosis, negative for atypia/hyperplasia.  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   Medications: She has a current medication list which includes the following prescription(s): acetaminophen , ascorbic acid, calcium-vitamin d, cetirizine, cyclobenzaprine , digestive enzymes, ibuprofen , meclizine hcl, melatonin, multivitamin with minerals, multiple vitamins-minerals, naproxen sodium, oxycodone , polyethylene glycol powder, probiotic product, thyroid, and zinc citrate.    Allergies: Patient is allergic to sulfa antibiotics.   Physical Exam: BP (!) 145/89   Pulse 64    Pelvic Examination: perineal incision well healed. Vagina: Incisions healing well. Sutures are present at incision lines and there is not granulation tissue. Sutures present at the cuff. No tenderness along the anterior or posterior vagina. No apical tenderness. No pelvic masses.  POP-Q: POP-Q  -3                                            Aa   -3                                           Ba  -8                                              C   2                                            Gh  5                                            Pb  8  tvl   -3                                            Ap  -3                                            Bp                                                 D     ---------------------------------------------------------  Assessment and Plan:  1. Post-operative state     - Healing well.  - Pathology results were reviewed with the patient today and she verbalized understanding that the results were benign.  - Can resume regular activity including exercise. Wait an additional 6 weeks for intercourse. Discussed avoidance of heavy lifting and straining long term to reduce the risk of recurrence.   Follow up as needed  Rosaline LOISE Caper, MD

## 2024-02-17 ENCOUNTER — Encounter: Payer: Self-pay | Admitting: Obstetrics and Gynecology

## 2024-02-18 ENCOUNTER — Telehealth: Payer: Self-pay

## 2024-02-18 NOTE — Telephone Encounter (Signed)
 Called and left a voicemail for additional information regarding her symptoms and rash

## 2024-02-21 NOTE — Telephone Encounter (Signed)
 Patient has scheduled a follow up for 02-22-2024

## 2024-02-22 ENCOUNTER — Ambulatory Visit: Admitting: Obstetrics and Gynecology

## 2024-02-22 ENCOUNTER — Encounter: Payer: Self-pay | Admitting: Obstetrics and Gynecology

## 2024-02-22 VITALS — BP 112/83 | HR 73

## 2024-02-22 DIAGNOSIS — R21 Rash and other nonspecific skin eruption: Secondary | ICD-10-CM

## 2024-02-22 NOTE — Progress Notes (Signed)
 [facility] Return Visit  SUBJECTIVE  History of Present Illness: Morgan Gibbs is a 77 y.o. female seen for a rash on her bilateral legs that she noticed since her hysterectomy.   Patient reports she has had a rash for a while on the insides of her legs and that it has spread up her stomach and is under her breasts as well.   Reports she saw PCP today and they prescribed a low dose steroid cream to treat and said it may be a heat rash.    Past Medical History: Patient  has a past medical history of Anxiety, Hammer toe of left foot, Hypothyroidism (2018), Pneumonia (11/2019), Wears contact lenses, and Wears hearing aid.   Past Surgical History: She  has a past surgical history that includes Vaginal prolapse repair (1995); Urethropexy (1995); Umbilical hernia repair (1975); Vaginal hysterectomy (N/A, 10/11/2023); Vaginal prolapse repair (N/A, 10/11/2023); Anterior and posterior repair (N/A, 10/11/2023); and Cystoscopy (N/A, 10/11/2023).   Medications: She has a current medication list which includes the following prescription(s): acetaminophen, ascorbic acid, calcium-vitamin d, cetirizine, cyclobenzaprine, digestive enzymes, ibuprofen, meclizine hcl, melatonin, multivitamin with minerals, multiple vitamins-minerals, naproxen sodium, oxycodone, polyethylene glycol powder, probiotic product, thyroid, and zinc citrate.   Allergies: Patient is allergic to sulfa antibiotics.   Social History: Patient  reports that she has never smoked. She has never used smokeless tobacco. She reports that she does not drink alcohol and does not use drugs.     OBJECTIVE     Physical Exam: Vitals:   02/22/24 1338  BP: 112/83  Pulse: 73   Gen: No apparent distress, A&O x 3.  Detailed Urogynecologic Evaluation:  Internal vaginal exam reveals vaginal tissues with noted atrophy. Noted sutures still at vaginal cuff. No noted bleeding, mass, or other concerning findings in the  vaginal canal.   Patient has a rash that is patch, red, and scaly in appearance. Endorses itchiness and worsening redness after shower.    ASSESSMENT AND PLAN    [name] is a 78 y.o. with:  1. Rash    Patient has a rash that appears like an eczema. She saw her PCP today who started her on a low dose steroid cream. I discussed with her that if the topical cream does not improve the rash we may consider tapered dose systemic steroids.  Patient to let us know in 5-7 days how the rash is and how she is feeling and we can further treat from there if needed.     Selmer Dominion, NP

## 2024-04-14 ENCOUNTER — Ambulatory Visit (INDEPENDENT_AMBULATORY_CARE_PROVIDER_SITE_OTHER)

## 2024-04-14 ENCOUNTER — Other Ambulatory Visit (HOSPITAL_COMMUNITY)
Admission: RE | Admit: 2024-04-14 | Discharge: 2024-04-14 | Disposition: A | Discharge status: Home / Self Care | Source: Ambulatory Visit | Attending: Obstetrics and Gynecology | Admitting: Obstetrics and Gynecology

## 2024-04-14 DIAGNOSIS — R319 Hematuria, unspecified: Secondary | ICD-10-CM

## 2024-04-14 DIAGNOSIS — R300 Dysuria: Secondary | ICD-10-CM

## 2024-04-14 DIAGNOSIS — R82998 Other abnormal findings in urine: Secondary | ICD-10-CM

## 2024-04-14 LAB — URINALYSIS, ROUTINE W REFLEX MICROSCOPIC
Bilirubin Urine: NEGATIVE
Glucose, UA: NEGATIVE mg/dL
Hgb urine dipstick: NEGATIVE
Ketones, ur: NEGATIVE mg/dL
Nitrite: NEGATIVE
Protein, ur: NEGATIVE mg/dL
Specific Gravity, Urine: 1.004 — ABNORMAL LOW (ref 1.005–1.030)
pH: 7 (ref 5.0–8.0)

## 2024-04-14 LAB — POCT URINALYSIS DIP (CLINITEK)
Bilirubin, UA: NEGATIVE
Glucose, UA: NEGATIVE mg/dL
Ketones, POC UA: NEGATIVE mg/dL
Nitrite, UA: POSITIVE — AB
POC PROTEIN,UA: NEGATIVE
Spec Grav, UA: <=1.005 — AB (ref 1.010–1.025)
Urobilinogen, UA: 0.2 U/dL
pH, UA: 6.5 (ref 5.0–8.0)

## 2024-04-14 MED ORDER — PHENAZOPYRIDINE HCL 200 MG PO TABS: 200.0000 mg | ORAL_TABLET | Freq: Three times a day (TID) | ORAL | 0 refills | Status: AC | PRN

## 2024-04-14 MED ORDER — NITROFURANTOIN MONOHYD MACRO 100 MG PO CAPS: 100.0000 mg | ORAL_CAPSULE | Freq: Two times a day (BID) | ORAL | 0 refills | Status: AC

## 2024-04-14 NOTE — Addendum Note (Signed)
 Addended by: Graciela Lava on: 04/14/2024 11:47 AM   Modules accepted: Orders

## 2024-04-14 NOTE — Progress Notes (Signed)
 Morgan Gibbs arrived today with dysuria, flank pain bilaterally, lower abdominal pain, and urinary frequency. Patient is notexperiencing fever, unstable vitals and/or one-sided back flank pain. Patient has not had had a recent hospitalization due to UTI.  Last visit in the office was 02/22/2024.  Per protocol:   The most recent Urinalysis completed on 5923-2025 and was notnormal.  Last Creatinine level No results found for: "CREATININE"  An urine specimen was collected and POCT urinalysis completed. [] A cath specimen was collected due to patient's current condition, symptoms or post-procedural state.  Total urine output by catheter is  Output by Drain (mL) 04/12/24 0701 - 04/12/24 1900 04/12/24 1901 - 04/13/24 0700 04/13/24 0701 - 04/13/24 1900 04/13/24 1901 - 04/14/24 0700 04/14/24 0701 - 04/14/24 1014  Patient has no LDAs of requested type attached.      POCT Urine results is not normal.  Urine micro was sent per protocol for abnormal urinalysis.  Urine culture was sent per protocol for abnormal urinalysis.     [x] Pt was notified of positive urine results and plan for additional urine testing. We will contact you within the next 3-4 days with these results.  [] No Prescription was sent to your pharmacy.  The additional testing will indicate if a prescription is needed.   [] Patient was notified of abnormal urine results. The following prescription is sent to your preferred pharmacy.  [x]  Macrobid 100mg  #10 1 tablet by mouth twice daily with food for 5 days      []  Bactrim DS 800-160mg  #6 1 tablet by mouth twice daily for 3 days        []  Due to your current medication allergies, an alternate prescription was discussed with your provider and will be prescribed and sent to your pharmacy.  [x] You can take over the counter AZO two tablets up to three times a day for two days. Take AZO tablets with a full glass of water . AZO will turn your urine orange, this is normal.   [] The patient was notified  of negative urine results.  If symptoms persist, you may take over the counter AZO two tablets up to three times a day for two days.  AZO will turn your urine orange, this is normal.  Contact the office back to schedule an appointment if your symptoms persist or worsen or you develop additional symptoms.       CC'd note to patient's provider.

## 2024-04-16 LAB — URINE CULTURE: Culture: 30000 — AB

## 2024-04-18 ENCOUNTER — Ambulatory Visit: Payer: Self-pay | Admitting: Obstetrics and Gynecology

## 2024-04-18 NOTE — Progress Notes (Signed)
 Treated with nitrofurantoin

## 2024-04-26 ENCOUNTER — Encounter: Payer: Self-pay | Admitting: Obstetrics and Gynecology

## 2024-04-27 ENCOUNTER — Other Ambulatory Visit: Payer: Self-pay | Admitting: Obstetrics and Gynecology

## 2024-04-27 DIAGNOSIS — R300 Dysuria: Secondary | ICD-10-CM

## 2024-04-27 DIAGNOSIS — R82998 Other abnormal findings in urine: Secondary | ICD-10-CM

## 2024-04-27 MED ORDER — NITROFURANTOIN MONOHYD MACRO 100 MG PO CAPS: 100.0000 mg | ORAL_CAPSULE | Freq: Two times a day (BID) | ORAL | 0 refills | Status: AC
